# Patient Record
Sex: Female | Born: 1975 | Race: White | Hispanic: No | Marital: Married | Smoking: Never smoker
Health system: Southern US, Community
[De-identification: ages and names within clinical notes are randomized; demographics above are authoritative.]

## PROBLEM LIST (undated history)

## (undated) DIAGNOSIS — L509 Urticaria, unspecified: Secondary | ICD-10-CM

## (undated) DIAGNOSIS — K8020 Calculus of gallbladder without cholecystitis without obstruction: Secondary | ICD-10-CM

## (undated) DIAGNOSIS — T7840XA Allergy, unspecified, initial encounter: Secondary | ICD-10-CM

## (undated) DIAGNOSIS — E785 Hyperlipidemia, unspecified: Secondary | ICD-10-CM

## (undated) DIAGNOSIS — E663 Overweight: Secondary | ICD-10-CM

## (undated) DIAGNOSIS — Z820 Family history of epilepsy and other diseases of the nervous system: Secondary | ICD-10-CM

## (undated) DIAGNOSIS — R6882 Decreased libido: Secondary | ICD-10-CM

## (undated) HISTORY — PX: BLADDER SUSPENSION: SHX72

## (undated) HISTORY — DX: Allergy, unspecified, initial encounter: T78.40XA

## (undated) HISTORY — DX: Overweight: E66.3

## (undated) HISTORY — DX: Family history of epilepsy and other diseases of the nervous system: Z82.0

## (undated) HISTORY — DX: Decreased libido: R68.82

## (undated) HISTORY — PX: ABDOMINAL HYSTERECTOMY: SHX81

## (undated) HISTORY — DX: Urticaria, unspecified: L50.9

## (undated) HISTORY — PX: TONSILLECTOMY: SUR1361

## (undated) HISTORY — DX: Hyperlipidemia, unspecified: E78.5

## (undated) HISTORY — DX: Calculus of gallbladder without cholecystitis without obstruction: K80.20

---

## 1998-06-11 ENCOUNTER — Other Ambulatory Visit: Admission: RE | Admit: 1998-06-11 | Discharge: 1998-06-11 | Payer: Self-pay | Admitting: Obstetrics & Gynecology

## 1998-07-26 ENCOUNTER — Inpatient Hospital Stay (HOSPITAL_COMMUNITY): Admission: AD | Admit: 1998-07-26 | Discharge: 1998-07-26 | Payer: Self-pay | Admitting: Obstetrics & Gynecology

## 1999-02-05 ENCOUNTER — Inpatient Hospital Stay (HOSPITAL_COMMUNITY): Admission: AD | Admit: 1999-02-05 | Discharge: 1999-02-06 | Payer: Self-pay | Admitting: Obstetrics & Gynecology

## 1999-10-29 ENCOUNTER — Other Ambulatory Visit: Admission: RE | Admit: 1999-10-29 | Discharge: 1999-10-29 | Payer: Self-pay | Admitting: Obstetrics & Gynecology

## 2001-06-05 ENCOUNTER — Encounter: Payer: Self-pay | Admitting: Emergency Medicine

## 2001-06-05 ENCOUNTER — Emergency Department (HOSPITAL_COMMUNITY): Admission: EM | Admit: 2001-06-05 | Discharge: 2001-06-05 | Payer: Self-pay

## 2001-06-13 ENCOUNTER — Emergency Department (HOSPITAL_COMMUNITY): Admission: EM | Admit: 2001-06-13 | Discharge: 2001-06-13 | Payer: Self-pay | Admitting: *Deleted

## 2001-06-14 ENCOUNTER — Emergency Department (HOSPITAL_COMMUNITY): Admission: EM | Admit: 2001-06-14 | Discharge: 2001-06-14 | Payer: Self-pay | Admitting: Emergency Medicine

## 2002-01-03 ENCOUNTER — Other Ambulatory Visit: Admission: RE | Admit: 2002-01-03 | Discharge: 2002-01-03 | Payer: Self-pay | Admitting: Obstetrics & Gynecology

## 2002-01-11 ENCOUNTER — Encounter: Payer: Self-pay | Admitting: Obstetrics and Gynecology

## 2002-01-11 ENCOUNTER — Ambulatory Visit (HOSPITAL_COMMUNITY): Admission: RE | Admit: 2002-01-11 | Discharge: 2002-01-11 | Payer: Self-pay | Admitting: Obstetrics and Gynecology

## 2002-11-27 ENCOUNTER — Inpatient Hospital Stay (HOSPITAL_COMMUNITY): Admission: AD | Admit: 2002-11-27 | Discharge: 2002-11-29 | Payer: Self-pay | Admitting: Obstetrics & Gynecology

## 2005-09-27 ENCOUNTER — Other Ambulatory Visit: Admission: RE | Admit: 2005-09-27 | Discharge: 2005-09-27 | Payer: Self-pay | Admitting: Obstetrics & Gynecology

## 2007-01-24 ENCOUNTER — Inpatient Hospital Stay (HOSPITAL_COMMUNITY): Admission: RE | Admit: 2007-01-24 | Discharge: 2007-01-26 | Payer: Self-pay | Admitting: Obstetrics & Gynecology

## 2007-01-24 ENCOUNTER — Encounter (INDEPENDENT_AMBULATORY_CARE_PROVIDER_SITE_OTHER): Payer: Self-pay | Admitting: *Deleted

## 2007-01-31 ENCOUNTER — Inpatient Hospital Stay (HOSPITAL_COMMUNITY): Admission: AD | Admit: 2007-01-31 | Discharge: 2007-01-31 | Payer: Self-pay | Admitting: Obstetrics & Gynecology

## 2007-02-03 ENCOUNTER — Ambulatory Visit (HOSPITAL_COMMUNITY): Admission: RE | Admit: 2007-02-03 | Discharge: 2007-02-04 | Payer: Self-pay | Admitting: Obstetrics and Gynecology

## 2007-03-25 ENCOUNTER — Inpatient Hospital Stay (HOSPITAL_COMMUNITY): Admission: AD | Admit: 2007-03-25 | Discharge: 2007-03-25 | Payer: Self-pay | Admitting: Obstetrics & Gynecology

## 2007-03-27 ENCOUNTER — Emergency Department (HOSPITAL_COMMUNITY): Admission: EM | Admit: 2007-03-27 | Discharge: 2007-03-27 | Payer: Self-pay | Admitting: Emergency Medicine

## 2007-06-13 ENCOUNTER — Ambulatory Visit (HOSPITAL_COMMUNITY): Admission: RE | Admit: 2007-06-13 | Discharge: 2007-06-13 | Payer: Self-pay | Admitting: Family Medicine

## 2007-10-13 ENCOUNTER — Encounter: Admission: RE | Admit: 2007-10-13 | Discharge: 2007-10-13 | Payer: Self-pay | Admitting: Family Medicine

## 2007-11-24 ENCOUNTER — Ambulatory Visit (HOSPITAL_BASED_OUTPATIENT_CLINIC_OR_DEPARTMENT_OTHER): Admission: RE | Admit: 2007-11-24 | Discharge: 2007-11-24 | Payer: Self-pay | Admitting: Orthopedic Surgery

## 2009-12-15 ENCOUNTER — Encounter: Admission: RE | Admit: 2009-12-15 | Discharge: 2009-12-15 | Payer: Self-pay | Admitting: Internal Medicine

## 2010-11-20 ENCOUNTER — Other Ambulatory Visit: Payer: Self-pay | Admitting: Dermatology
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
## 2011-03-05 NOTE — Op Note (Signed)
NAME:  Marisa Chen, BISCHOFF NO.:  [id number]   MEDICAL RECORD NO.:  [id number]          PATIENT TYPE:  AMB   LOCATION:  SDC                           FACILITY:  [facility]   PHYSICIAN:  Randye Lobo, M.D.   DATE OF BIRTH:  09-Jan-1976   DATE OF PROCEDURE:  01/24/2007  DATE OF DISCHARGE:                               OPERATIVE REPORT   PREOPERATIVE DIAGNOSIS:  1. Genuine stress incontinence.  2. Incomplete vaginal prolapse.   POSTOPERATIVE DIAGNOSIS:  1. Genuine stress incontinence.  2. Incomplete vaginal prolapse.   PROCEDURE:  Tension-free vaginal tape suburethral sling, cystoscopy,  anterior colporrhaphy.   SURGEON:  Conley Simmonds, M.D.   ASSISTANT:  Annamaria Helling, M.D.   ANESTHESIA:  Is general endotracheal.   TOTAL IV FLUIDS:  2000 mL   ESTIMATED BLOOD LOSS:  125 mL   URINE OUTPUT:  50 mL   COMPLICATIONS:  None.   INDICATIONS FOR PROCEDURE:  The patient is a 35 year old para 70  Caucasian female who presented to her primary gynecologist, Dr. Annamaria Helling with a complaint of postcoital bleeding and post exercise bleeding.  The patient at that time was noted to have first to second-degree  uterine prolapse.  Upon further questioning, the patient also reported a  history of urinary incontinence with stressful maneuvers.  The patient  did have urodynamic testing performed in the office which confirmed the  presence of genuine stress incontinence.  The patient was also noted to  have a second-degree cystocele.  A plan is now made to proceed with a  total vaginal hysterectomy under the direction of Dr. Annamaria Helling and to  proceed with a tension-free vaginal tape suburethral sling, cystoscopy,  and anterior colporrhaphy under my direction after risks, benefits, and  alternatives have been reviewed.   FINDINGS:  Examination under anesthesia revealed a second-degree uterine  prolapse.  There was a first degree cystocele.  There was evidence of  good posterior  vaginal wall support.   Cystoscopy documented a normal bladder throughout 360 degrees including  the bladder dome and trigone.  There was no evidence of a foreign body  in either the bladder or the urethra with placement of the sling.  The  ureters were noted to be patent bilaterally.   SPECIMEN:  None.   PROCEDURE:  The patient was reidentified in the preoperative hold area.  She did receive clindamycin 900 mg IV for antibiotic prophylaxis.  She  received both TED hose and PAS stockings for DVT prophylaxis.   In the operating room, general endotracheal anesthesia was induced and  the patient was placed in the dorsal lithotomy position.  Dr. Annamaria Helling performed the vaginal hysterectomy.  Please refer to this dictation  separately.  At termination of the vaginal hysterectomy the peritoneum  was closed and the vaginal cuff was opened.  Hemostasis was good.   I then took over the procedure.  A Foley catheter was placed in the  bladder and left to gravity drainage throughout and after the procedure.  A weighted speculum had been previously placed inside the vagina.  The  anterior vaginal wall was marked with Allis clamps in the midline.  The  vaginal mucosa was then injected with half percent lidocaine with  1:200,000 of epinephrine.  The vaginal mucosa was incised vertically  with a scalpel in the midline.  The vaginal mucosa was dissected off of  the endopelvic fascia and subvaginal tissue bilaterally using a  combination of sharp and blunt dissection.  The dissection was carried  back to the level of the pubic rami cranially and down to the level of  the vaginal cuff caudally.  Hemostasis during the dissection was  achieved with monopolar cautery.   The sling was performed next.  It was performed in a top-down fashion.  1 cm suprapubic incisions were created 2.5 cm to the right and left of  the midline.  The abdominal needle passer was then placed first through  the right  suprapubic incision and out through the endopelvic fascia on  the ipsilateral side.  The same procedure that was performed on the  right-hand side was then repeated on the left-hand side.  The Foley  catheter was removed and cystoscopy was performed at this time and the  findings were as noted above.  After cystoscopy was performed the  cystoscopy fluid was drained from the bladder and the Foley catheter was  replaced.  The sling was attached at this time to the abdominal needle  passers and the sling was drawn up through the suprapubic incisions  bilaterally.  The sling was separated from the surrounding plastic  sheath.  A Kelly clamp was placed between the urethra and the sling and  the plastic sheaths were all removed at this time for final placement of  the sling.  The placement was noted to be good.  Excess sling material  was trimmed suprapubically.   The anterior colporrhaphy was performed with a series of vertical  mattress sutures of 2-0 Vicryl for reduction of the cystocele.  There  was some bleeding noted overlying some veins on the patient's right-hand  side of the bladder.  These did respond to figure-of-eight sutures of 2-  0 Vicryl.  There was also some bleeding noted from the exit site of the  sling and the patient's right-hand side and again a figure-of-eight  suture of 2-0 Vicryl was placed in this location.  Gelfoam was also  placed in this region.  Hemostasis was satisfactory.  Excess vaginal  mucosa was trimmed and the anterior vaginal wall was then closed with a  running locked suture of 2-0 Vicryl.   The vaginal cuff was closed with a running locked suture of 0 Vicryl.  The culdoplasty suture was tied at this time and there was good support  of the vaginal apex.  The vaginal packing was placed in the vagina and  this had Estrace cream on it.   The suprapubic incisions were closed with Dermabond.  The patient was cleansed of Betadine, taken out of the dorsal  lithotomy  position and escorted to the recovery room in stable and awake  condition.  There were no complications to the procedure.  All needle,  instrument, sponge counts were correct.      Randye Lobo, M.D.  Electronically Signed     BES/MEDQ  D:  01/24/2007  T:  01/24/2007  Job:  [id number]

## 2011-03-05 NOTE — H&P (Signed)
NAME:  Marisa Chen, TOSTE NO.:  [id number]   MEDICAL RECORD NO.:  [id number]          PATIENT TYPE:  AMB   LOCATION:  SDC                           FACILITY:  [facility]   PHYSICIAN:  Gerrit Friends. Aldona Bar, M.D.   DATE OF BIRTH:  [birth year]   DATE OF ADMISSION:  01/24/2007  DATE OF DISCHARGE:                              HISTORY & PHYSICAL   HISTORY OF PRESENT ILLNESS:  Sedalia Greeson is a 35 year old gravida 4,  para 3, AB 1 white female being taken to the Operating Room for a total  abdominal hysterectomy, a transvaginal sling, an anterior repair and  cystoscopy because of a history of menorrhagia, first to second-degree  uterine prolapse, and stress urinary incontinence.   This patient was seen by me in February 2008 complaining of worsening  stress urinary incontinence demonstrated by a history of stress urinary  incontinence and first, almost second-degree uterine prolapse.  She was  also complaining of some irregular menses.  It was determined at that  point that a vaginal hysterectomy was appropriate because of the pelvic  relaxation, and urodynamics was scheduled to evaluate the patient's  incontinence.  She subsequently underwent urodynamics in consultation  with Dr. Conley Simmonds with the determination that the patient's problem  was related to stress urinary incontinence and, therefore, as a part of  this operative procedure Dr. Edward Jolly will perform a TVT and cystoscopy and  anterior repair.   The patient's history is relatively benign.  She has had three vaginal  deliveries with some babies that were in excess of nine pounds.  She has  noticed some symptoms of pelvic pressure for the past year or two along  with stress urinary incontinence which has been progressive and has  become more of a problem within the past 3-4 months, especially with  increased exercising.  All of her cervical cytology in the past has been  normal, and basically she has been, and remains, in good  health.   Her husband had a vasectomy, so further childbearing is not an issue.   PAST MEDICAL HISTORY:  Negative with the exception of the above.   ALLERGIES:  The patient is allergic to PENICILLIN and CIPRO.   PAST SURGICAL HISTORY:  Essentially none.   FAMILY HISTORY/REVIEW OF SYSTEMS/SOCIAL HISTORY:  Essentially negative -  noncontributory.   PHYSICAL EXAMINATION:  GENERAL:  Examination at the time of admission  finds a well-developed female.  VITAL SIGNS:  Weight ___, height 5 feet 3 inches, blood pressure 110/70,  temperature 98.2, pulse 80 and regular, respirations 18 and regular.  HEENT:  Negative.  NECK:  Thyroid not enlarged.  CHEST:  Clear to auscultation and percussion.  CARDIOVASCULAR:  Regular rhythm without murmur.  BREASTS:  Negative.  ABDOMEN:  Unremarkable.  PELVIC:  External genitalia BUS normal.  There is a first to second-  degree uterine prolapse.  The uterus is normal size, very mobile.  There  is a first-degree cystocele, and remarkably no rectocele.  Rectovaginal  exam confirmatory.  EXTREMITIES:  Negative.  NEUROLOGIC:  Physiologic.   IMPRESSION:  First to  second-degree uterine prolapse associated with  stress urinary incontinence.   PLAN:  I will perform a total vaginal hysterectomy, and Dr. Edward Jolly will  perform a TVT, anterior repair, and cystoscopy.      Gerrit Friends. Aldona Bar, M.D.  Electronically Signed     RMW/MEDQ  D:  01/19/2007  T:  01/19/2007  Job:  [id number]

## 2011-03-05 NOTE — Op Note (Signed)
NAME:  Marisa Chen, Marisa Chen NO.:  [id number]   MEDICAL RECORD NO.:  [id number]          PATIENT TYPE:  OIB   LOCATION:  9305                          FACILITY:  [facility]   PHYSICIAN:  Randye Lobo, M.D.   DATE OF BIRTH:  June 24, 1976   DATE OF PROCEDURE:  02/03/2007  DATE OF DISCHARGE:  02/04/2007                               OPERATIVE REPORT   PREOPERATIVE DIAGNOSIS:  1. Status post total vaginal hysterectomy with McCall's culdoplasty,      tension free vaginal tape suburethral sling, cystoscopy, and      anterior colporrhaphy on January 24, 2007.  2..  Anterior vaginal wall hematoma.   POSTOPERATIVE DIAGNOSIS:  1. Status post total vaginal hysterectomy with McCall's culdoplasty,      tension free vaginal tape suburethral sling, cystoscopy and      anterior colporrhaphy on January 24, 2007.  2. Anterior vaginal wall hematoma.   PROCEDURE:  Evacuation of the anterior vaginal wall hematoma and repair  of vaginal wall dehiscence, cystoscopy.   SURGEON:  Randye Lobo, M.D.   ANESTHESIA:  General endotracheal anesthesia.   ESTIMATED BLOOD LOSS:  Less than 50 mL   URINE OUTPUT:  Quantity sufficient.   COMPLICATIONS:  None.   INDICATIONS FOR PROCEDURE:  The patient is a 35 year old gravida 4 para  3-0-1-3, Caucasian female, status post total vaginal hysterectomy and  McCall's culdoplasty with the tension free vaginal tape suburethral  sling, cystoscopy, and anterior colporrhaphy on January 24, 2007, under the  direction of Dr. Annamaria Helling and Dr. Conley Simmonds, who presented to the  office on February 03, 2007, with vaginal bleeding and pain in the rectum  with voiding.  The patient had undergone an uneventful surgical  procedure and was discharged to home on January 26, 2007, with a Foley  catheter to gravity drainage due to some intermittent urinary retention  postop.  The patient's white blood cell count on postoperative day one  was elevated, but this returned down back to a  normal range by  postoperative day two.  The patient did receive surgical prophylaxis of  antibiotics with clindamycin due to her penicillin and ciprofloxacin  allergies.  The patient was sent home with a prescription of Macrobid as  she was discharged with her Foley catheter in place and had gone through  multiple catheterizations.   The patient was seen for her first postoperative visit on January 30, 2007.  She had taken her catheter out that morning and was able to void  well in the office with a 25 mL residual urine volume.  The patient was  examined and, at that time, she was noted to have a 0.5 cm left anterior  vaginal wall mucosal opening which was draining a small hematoma.  The  vaginal sutures were all noted to be intact at that time including the  vaginal cuff.  One day later, the patient had her Foley catheter  replaced due to urinary retention.  She was continued on antibiotics at  that time.   The patient was seen in the office on the  date of this procedure, today,  for her vaginal bleeding and discomfort and, at that time, the anterior  vaginal wall was noted to have a dehiscence and there was evidence of an  old hematoma.  The vaginal cuff appeared to be intact at this time.  A  discussion was held with the patient regarding her diagnosis and a plan  was made to bring the patient to the operating room for further  evacuation of the hematoma, closure of the anterior vaginal wall,  exploration of the operative sites, and cystoscopy.  The patient chose  to proceed after risks, benefits, and alternatives were reviewed.   FINDINGS:  The anterior vaginal wall was noted to be open along 2/3 of  the incision length.  There was old hematoma which was adherent to the  bladder underlying the vaginal mucosa.  The suburethral sling was  evaluated and was noted to be in good position and already had some  tissue ingrowth.  There was no obvious area of active bleeding  appreciated.   The vaginal cuff and the culdoplasty suture were noted to  be intact.   DESCRIPTION OF PROCEDURE:  The patient was reidentified in the  preoperative hold area.  She did receive clindamycin 900 mg IV for  antibiotic prophylaxis.  The patient had PAS stockings and TED hose for  DVT prophylaxis.  In the operating room, general endotracheal anesthesia  was induced.  The patient was then placed in the dorsal lithotomy  position.  The vagina, lower abdomen, and perineum were then sterilely  prepped and draped.  A new Foley catheter was sterilely placed inside  the bladder.   An examination under anesthesia was performed and the findings are as  noted above.  Saline was used to irrigate the anterior vaginal wall and  operative site.  A sponge was also used to remove any adherent clots  from the surgical field.  There was no evidence of any active bleeding  appreciated.  Cystoscopy was performed after the Foley catheter was  removed.  There was no evidence of foreign body in the bladder or the  urethra.  The bladder was visualized throughout 360 degrees including  the bladder dome and trigone.  The ureters were noted to be patent  bilaterally.  The cystoscope was removed and the Foley catheter was  replaced to drain the bladder.   The edges of the vaginal mucosa were sharply excised so that the vaginal  mucosa had healthy edges and good blood supply to the tissue.  The  anterior vaginal wall was then closed at this time with a running locked  suture of 0 Vicryl.  There was good hemostasis appreciated.  An Estrace  gauze packing was then placed in the vagina.  This concluded the  patient's procedure.  There were no complications.  All needle,  instrument, and sponge counts were correct.      Randye Lobo, M.D.  Electronically Signed     BES/MEDQ  D:  02/03/2007  T:  02/04/2007  Job:  [id number]

## 2011-03-05 NOTE — Discharge Summary (Signed)
NAME:  Marisa Chen, Marisa Chen NO.:  [id number]   MEDICAL RECORD NO.:  [id number]          PATIENT TYPE:  INP   LOCATION:  9304                          FACILITY:  [facility]   PHYSICIAN:  Gerrit Friends. Aldona Bar, M.D.   DATE OF BIRTH:  04-08-1976   DATE OF ADMISSION:  01/24/2007  DATE OF DISCHARGE:  01/26/2007                               DISCHARGE SUMMARY   DISCHARGE DIAGNOSES:  Status post total vaginal hysterectomy, anterior  repair, transvaginal tape, cystoscopy - for uterine prolapse and stress  urinary incontinence.   SUMMARY:  This 35 year old female was admitted to the hospital on the  day of surgery at which time she underwent a total vaginal hysterectomy,  anterior repair, TVT and cystoscopy for uterine prolapse and associated  stress urinary incontinence.  Procedure went well.  Pathology pending at  the time of discharge.  The patient's admitting hemoglobin was 14.7.  Discharge hemoglobin 11.9.  Discharge white count 9400 with a normal  platelet count.  Her postoperative course initially was complicated by  some urinary retention after an attempt to remove the catheter on April  9.  Catheter was replaced ,and the patient was continued to be observed  in the hospital, and by the morning of April 10, was able to void well  with no significant postvoid residual, and on the morning of April 10,  she was ambulating well, tolerating a regular diet well, having normal  bowel function and bladder function.  Was afebrile, was comfortable and  was deemed ready for discharge.  As mentioned, pathology on the uterus  is still pending.   DISCHARGE MEDICATIONS:  1. Tylox 1 to 2 every 4 to 6 hours as needed for severe pain.  2. Motrin 600 mg every 6 hours as needed for less severe pain or      cramping.  3. Ambien 10 mg at bedtime as needed for sleep.  4. Macrobid 1 twice a day for the next 4 days for bladder prophylaxis.   She will return to see Dr. Edward Jolly who did the anterior repair,  TVT and  cystoscopy in 10 days and return to see me in approximately three weeks'  time.  She understood all instructions well at the time of discharge and  was discharged to home in good condition.      Gerrit Friends. Aldona Bar, M.D.  Electronically Signed     RMW/MEDQ  D:  01/26/2007  T:  01/26/2007  Job:  [id number]

## 2011-03-05 NOTE — Op Note (Signed)
NAME:  Marisa Chen, OLDS NO.:  [id number]   MEDICAL RECORD NO.:  [id number]          PATIENT TYPE:  AMB   LOCATION:  SDC                           FACILITY:  [facility]   PHYSICIAN:  Gerrit Friends. Aldona Bar, M.D.   DATE OF BIRTH:  [birth year]   DATE OF PROCEDURE:  01/24/2007  DATE OF DISCHARGE:                               OPERATIVE REPORT   PREOPERATIVE DIAGNOSIS:  1. Menorrhagia.  2. Stress urinary incontinence.  3. Incomplete vaginal and uterine prolapse.   PROCEDURES:  Total vaginal hysterectomy - Dr. Aldona Bar.  Anterior  colporrhaphy, TVT and cystoscopy by Dr. Edward Jolly.   ANESTHESIA:  General endotracheal.   DESCRIPTION OF PROCEDURE:  The patient was taken to the operating room  where after the satisfactory induction of general endotracheal  anesthesia she was prepped and draped and placed in the modified  lithotomy position in the short Allen stirrups.  She was prepped and  draped, the bladder was drained of clear urine with red rubber catheter  in-and-out fashion.  At this time the vaginal hysterectomy portion of  procedure was begun.   A posterior weighted speculum put into place and the cervix grasped with  a double tooth tenaculum and pulled down to the introitus with ease.  At  this time the cervix was circumferentially injected with dilute solution  Neo-Synephrine to aid in hemostasis and thereafter a circumferential  incision was made about the cervix and vagina pushed off the cervix with  ease.  Posterior peritoneum was entered without difficulty and the  posterior surface of uterus palpated normally with no adhesions felt.  At this time the uterosacral pedicles were taken bilaterally using  curved Heaney clamps and suture ligature of 0 Vicryl suture followed by  an additional parametrial bite taken in similar fashion bilaterally.   At this time it was possible to dissect out the anterior peritoneum and  prior to the anterior peritoneum being entered an additional  parametrial  bite was taken using curved Heaney clamps and suture ligature of 0  Vicryl suture.  At this time the anterior peritoneum was entered and  confirmation by visualizing bowel was noted.  At this time the uterine  artery pedicles were taken bilaterally using curved Heaney clamps and  suture ligature of 0 Vicryl suture.  One additional parametrial bite was  taken above the uterine artery pedicles bilaterally in the similar  fashion a previously described and at this time the specimen was  everted.  The ovarian pedicles were clamped, cut and thereafter doubly  suture secured with 0 Vicryl suture.  Specimen was removed and sent to  pathology labeled appropriately.  At this time all pedicles noted to be  dry.  The posterior cuff at this time was closed in a hemostatic fashion  using 0 Vicryl running locking fashion from uterosacral to uterosacral.  A McCall culdoplasty suture was then placed using 0 Vicryl suture and  left untied.  All pedicles noted to be dry.  Decision was made at this  time to close the peritoneum which was carried out in a pursestring  fashion with  0 Vicryl suture.  Extraperitonealizing all pedicles with  the  exception of the ovarians which were cut free.  At this point in  procedure with hemostasis excellent, the patient's vital signs stable,  attention was turned to the anterior colporrhaphy and TVT and cystoscopy  which was carried out by Dr. Edward Jolly with my assistance and the remainder  of the operative note will be dictated by Dr. Edward Jolly.      Gerrit Friends. Aldona Bar, M.D.  Electronically Signed     RMW/MEDQ  D:  01/24/2007  T:  01/24/2007  Job:  [id number]

## 2011-07-09 LAB — POCT HEMOGLOBIN-HEMACUE: Hemoglobin: 14.2

## 2011-08-05 LAB — COMPREHENSIVE METABOLIC PANEL
AST: 21
Albumin: 4.1
Calcium: 9.2
Chloride: 101
Creatinine, Ser: 0.79
GFR calc Af Amer: >60
Sodium: 134 — ABNORMAL LOW
Total Bilirubin: 0.8

## 2011-08-05 LAB — URINALYSIS, ROUTINE W REFLEX MICROSCOPIC
Ketones, ur: 15 — AB
Leukocytes, UA: NEGATIVE
Nitrite: NEGATIVE
Protein, ur: NEGATIVE
pH: 6.5

## 2011-08-05 LAB — CBC
HCT: 38.5
MCV: 86.4
Platelets: 250
RDW: 12.6

## 2011-08-05 LAB — DIFFERENTIAL
Eosinophils Relative: 0
Lymphocytes Relative: 6 — ABNORMAL LOW
Lymphs Abs: 0.8
Monocytes Absolute: 0.4

## 2011-08-05 LAB — URINE MICROSCOPIC-ADD ON

## 2011-12-01 ENCOUNTER — Other Ambulatory Visit: Payer: Self-pay | Admitting: Dermatology

## 2012-02-24 ENCOUNTER — Ambulatory Visit (HOSPITAL_BASED_OUTPATIENT_CLINIC_OR_DEPARTMENT_OTHER)
Admission: RE | Admit: 2012-02-24 | Discharge: 2012-02-24 | Disposition: A | Discharge status: Home / Self Care | Payer: BC Managed Care – PPO | Source: Ambulatory Visit | Attending: Family Medicine | Admitting: Family Medicine

## 2012-02-24 ENCOUNTER — Other Ambulatory Visit (HOSPITAL_BASED_OUTPATIENT_CLINIC_OR_DEPARTMENT_OTHER): Payer: Self-pay | Admitting: Family Medicine

## 2012-02-24 DIAGNOSIS — R1011 Right upper quadrant pain: Secondary | ICD-10-CM

## 2012-02-24 DIAGNOSIS — K8020 Calculus of gallbladder without cholecystitis without obstruction: Secondary | ICD-10-CM | POA: Insufficient documentation

## 2012-02-24 DIAGNOSIS — R109 Unspecified abdominal pain: Secondary | ICD-10-CM | POA: Insufficient documentation

## 2012-02-24 DIAGNOSIS — R110 Nausea: Secondary | ICD-10-CM | POA: Insufficient documentation

## 2012-03-15 HISTORY — PX: CHOLECYSTECTOMY: SHX55

## 2012-04-11 ENCOUNTER — Other Ambulatory Visit (HOSPITAL_BASED_OUTPATIENT_CLINIC_OR_DEPARTMENT_OTHER): Payer: Self-pay | Admitting: Family Medicine

## 2012-04-11 DIAGNOSIS — Z1231 Encounter for screening mammogram for malignant neoplasm of breast: Secondary | ICD-10-CM

## 2012-04-25 ENCOUNTER — Ambulatory Visit (HOSPITAL_BASED_OUTPATIENT_CLINIC_OR_DEPARTMENT_OTHER)
Admission: RE | Admit: 2012-04-25 | Discharge: 2012-04-25 | Disposition: A | Discharge status: Home / Self Care | Payer: BC Managed Care – PPO | Source: Ambulatory Visit | Attending: Family Medicine | Admitting: Family Medicine

## 2012-04-25 DIAGNOSIS — Z1231 Encounter for screening mammogram for malignant neoplasm of breast: Secondary | ICD-10-CM | POA: Insufficient documentation

## 2013-02-07 ENCOUNTER — Telehealth: Payer: Self-pay | Admitting: Family Medicine

## 2013-02-07 NOTE — Telephone Encounter (Signed)
PT CALLED AND HAD A QUESTION FOR DR. ZANARD.  A FRIEND OF HER'S IS PREGNANT AND WAS BITTEN BY A TICK.  FRIEND DOES NOT HAVE INS. AND PT WAS NEEDING ADVICE.  I ADVISED I WOULD GIVE THE DR. THE MESSAGE.  PT CALL BACK NUMBER [phone]

## 2013-05-28 ENCOUNTER — Encounter: Payer: Self-pay | Admitting: Family Medicine

## 2013-05-28 ENCOUNTER — Ambulatory Visit (INDEPENDENT_AMBULATORY_CARE_PROVIDER_SITE_OTHER): Payer: No Typology Code available for payment source | Admitting: Family Medicine

## 2013-05-28 VITALS — BP 119/80 | HR 60 | Resp 16 | Ht 63.0 in | Wt 149.0 lb

## 2013-05-28 DIAGNOSIS — L989 Disorder of the skin and subcutaneous tissue, unspecified: Secondary | ICD-10-CM

## 2013-05-28 NOTE — Progress Notes (Signed)
  Subjective:    Patient ID: Marisa Chen, female    DOB: 1976/04/14, 37 y.o.   MRN: [id number]  HPI  Marisa Chen is here to have a red spot on her chest evaluated.  It appeared a couple of weeks ago.  She denies any pain and says that it does not itch.    Review of Systems  Skin:       Red Spot on Chest     Past Medical History  Diagnosis Date  . Hyperlipidemia   . FHx: migraine headaches   . Cholelithiasis   . Decreased libido   . Overweight(278.02)     Family History  Problem Relation Age of Onset  . Heart disease Mother   . Hyperlipidemia Mother   . Asthma Sister   . Cancer Maternal Grandmother     Breast Cancer  . Heart disease Maternal Grandfather     History   Social History Narrative   Marital Status: Married IT sales professional)    Children:  Daughters (2) Son (1)    Pets: Cows/ Chickens/ Dogs    Living Situation: Lives with husband and children.   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     Education: Engineer, agricultural   Tobacco Use/Exposure:  None    Alcohol Use:  Occasional   Drug Use:  None   Diet:  Regular   Exercise: Limited    Hobbies: Art/Running               Objective:   Physical Exam  Vitals reviewed. Constitutional: She appears well-nourished. No distress.  Eyes: No scleral icterus.  Cardiovascular: Normal rate and regular rhythm.   Pulmonary/Chest: No respiratory distress.  Skin:             Assessment & Plan:

## 2013-05-28 NOTE — Patient Instructions (Addendum)
1)  Skin  Lesion - This lesion looks like it could be either a cherry hemangioma vs dermatofibroma vs basal cell.  I would recommend that you follow up with Dr. Margo Aye.  You have an appointment on Sept 2 nd at 3:40 pm.  He is off of [address] near [employer].    Cherry Angioma Cherry angiomas are noncancerous (benign) skin growths. They are made up of a clump of blood vessels. They occur most often in people over the age of 28. CAUSES  The cause of these skin growths is unknown, but they appear to run in families. SYMPTOMS  Cherry angiomas are smooth, round, red bumps on the skin. They can be as small as a pinhead or as big as a pencil eraser. The color may darken to a purplish red over time. Cherry angiomas are usually found on the trunk, but they can occur anywhere on the body. They are painless, but they may bleed if they are injured. The bleeding is not serious and will stop when firm pressure is applied. DIAGNOSIS  Your caregiver can usually tell what is wrong by performing a physical exam. A tissue sample (biopsy) may also be taken and examined under a microscope. TREATMENT  Usually no treatment is needed for cherry angiomas. They may be removed for improved appearance (cosmetic) reasons. Sometimes, cherry angiomas come back after removal. Removal methods include:  Electrocautery. Heat is used to burn the growth off the skin.  Cryosurgery. Liquid nitrogen is applied to the growth to freeze it. The growth eventually falls off the skin.  Surgery. HOME CARE INSTRUCTIONS  If your skin was covered with a bandage, change and remove the bandage as directed by your caregiver.  Check your skin regularly for any changes. SEEK MEDICAL CARE IF: You notice any changes or new growths on your skin. Document Released: 12/13/2001 Document Revised: 12/27/2011 Document Reviewed: 11/12/2011 [facility] Patient Information 2014 [place], [place].

## 2013-07-14 ENCOUNTER — Encounter: Payer: Self-pay | Admitting: Family Medicine

## 2013-07-14 DIAGNOSIS — L989 Disorder of the skin and subcutaneous tissue, unspecified: Secondary | ICD-10-CM | POA: Insufficient documentation

## 2013-07-14 NOTE — Assessment & Plan Note (Addendum)
I am not sure what this lesion is.  I think it could be either a cherry hemangioma vs dermatofibroma vs a basal cell.  She is scheduled to see Dr. Margo Aye on 06/19/13 at 3:40.

## 2014-01-03 ENCOUNTER — Ambulatory Visit: Payer: No Typology Code available for payment source | Admitting: Family Medicine

## 2014-02-11 ENCOUNTER — Encounter (INDEPENDENT_AMBULATORY_CARE_PROVIDER_SITE_OTHER): Payer: Self-pay

## 2014-02-11 ENCOUNTER — Ambulatory Visit (INDEPENDENT_AMBULATORY_CARE_PROVIDER_SITE_OTHER): Payer: BC Managed Care – PPO | Admitting: Family Medicine

## 2014-02-11 ENCOUNTER — Encounter: Payer: Self-pay | Admitting: Family Medicine

## 2014-02-11 VITALS — BP 116/77 | HR 62 | Resp 16 | Wt 171.0 lb

## 2014-02-11 DIAGNOSIS — G43909 Migraine, unspecified, not intractable, without status migrainosus: Secondary | ICD-10-CM

## 2014-02-11 DIAGNOSIS — R5381 Other malaise: Secondary | ICD-10-CM

## 2014-02-11 DIAGNOSIS — E669 Obesity, unspecified: Secondary | ICD-10-CM

## 2014-02-11 DIAGNOSIS — R5383 Other fatigue: Secondary | ICD-10-CM

## 2014-02-11 MED ORDER — PHENDIMETRAZINE TARTRATE 35 MG PO TABS: 1.0000 | ORAL_TABLET | Freq: Three times a day (TID) | ORAL | Status: DC

## 2014-02-11 MED ORDER — TOPIRAMATE 50 MG PO TABS: 50.0000 mg | ORAL_TABLET | Freq: Two times a day (BID) | ORAL | Status: DC

## 2014-02-11 MED ORDER — CYANOCOBALAMIN 1000 MCG/ML IJ SOLN: INTRAMUSCULAR | Status: AC

## 2014-02-11 NOTE — Progress Notes (Signed)
   Subjective:    Patient ID: Marisa Chen, female    DOB: March 23, 1976, 38 y.o.   MRN: [id number]  HPI  Marisa Chen is here today for a couple of issues:  1)  Migraine Headaches - She has been having increased headaches.    2)  Weight - She would like to do another round of HCG.     Review of Systems  Constitutional: Positive for fatigue and unexpected weight change. Negative for activity change and appetite change.  Respiratory: Negative for shortness of breath.   Cardiovascular: Negative for chest pain and palpitations.  Gastrointestinal: Negative.   Genitourinary: Negative.   Neurological: Positive for headaches.  Psychiatric/Behavioral: Negative.      Past Medical History  Diagnosis Date  . Hyperlipidemia   . FHx: migraine headaches   . Cholelithiasis   . Decreased libido   . Overweight(278.02)   . Allergy      Past Surgical History  Procedure Laterality Date  . Abdominal hysterectomy    . Bladder suspension    . Tonsillectomy    . Cholecystectomy  03/15/2012     History   Social History Narrative   Marital Status: Married IT sales professional(Jonathan)    Children:  Daughters (2) Son (1)    Pets: Cows/ Chickens/ Dogs    Living Situation: Lives with husband and children.   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     Education: Engineer, agriculturalHigh School Graduate   Tobacco Use/Exposure:  None    Alcohol Use:  Occasional   Drug Use:  None   Diet:  Regular   Exercise: Limited    Hobbies: Art/Running                 Family History  Problem Relation Age of Onset  . Heart disease Mother   . Hyperlipidemia Mother   . Asthma Sister   . Cancer Maternal Grandmother     Breast Cancer  . Heart disease Maternal Grandfather      Allergies  Allergen Reactions  . Ciprofloxacin   . Penicillins      Immunization History  Administered Date(s) Administered  . Tdap 10/18/2006        Objective:   Physical Exam  Vitals  reviewed. Constitutional: She is oriented to person, place, and time.  HENT:  Head: Normocephalic and atraumatic.  Eyes: Conjunctivae are normal. No scleral icterus.  Neck: Normal range of motion. Neck supple. No thyromegaly present.  Cardiovascular: Normal rate and regular rhythm.   Pulmonary/Chest: Effort normal and breath sounds normal.  Musculoskeletal: She exhibits no edema.  Neurological: She is alert and oriented to person, place, and time.  Skin: Skin is warm and dry. No rash noted.  Psychiatric: She has a normal mood and affect. Her behavior is normal. Judgment and thought content normal.       Assessment & Plan:    Marisa Chen was seen today for weight check.  Diagnoses and associated orders for this visit:  Migraine, unspecified, without mention of intractable migraine without mention of status migrainosus - topiramate (TOPAMAX) 50 MG tablet; Take 1 tablet (50 mg total) by mouth 2 (two) times daily.  Other malaise and fatigue - cyanocobalamin (,VITAMIN B-12,) 1000 MCG/ML injection; Inject .5 ml (50 units) into the muscle weekly  Obesity, unspecified - Phendimetrazine Tartrate 35 MG TABS; Take 1 tablet (35 mg total) by mouth 3 (three) times daily before meals.

## 2014-03-14 ENCOUNTER — Encounter: Payer: Self-pay | Admitting: Family Medicine

## 2014-03-14 ENCOUNTER — Ambulatory Visit (HOSPITAL_BASED_OUTPATIENT_CLINIC_OR_DEPARTMENT_OTHER)
Admission: RE | Admit: 2014-03-14 | Discharge: 2014-03-14 | Disposition: A | Discharge status: Home / Self Care | Payer: BC Managed Care – PPO | Source: Ambulatory Visit | Attending: Family Medicine | Admitting: Family Medicine

## 2014-03-14 ENCOUNTER — Ambulatory Visit (INDEPENDENT_AMBULATORY_CARE_PROVIDER_SITE_OTHER): Payer: BC Managed Care – PPO | Admitting: Family Medicine

## 2014-03-14 ENCOUNTER — Other Ambulatory Visit: Payer: Self-pay | Admitting: Family Medicine

## 2014-03-14 VITALS — BP 128/77 | HR 68 | Resp 16 | Ht 64.0 in | Wt 164.0 lb

## 2014-03-14 DIAGNOSIS — M25572 Pain in left ankle and joints of left foot: Secondary | ICD-10-CM | POA: Insufficient documentation

## 2014-03-14 DIAGNOSIS — W010XXA Fall on same level from slipping, tripping and stumbling without subsequent striking against object, initial encounter: Secondary | ICD-10-CM | POA: Insufficient documentation

## 2014-03-14 DIAGNOSIS — S8263XA Displaced fracture of lateral malleolus of unspecified fibula, initial encounter for closed fracture: Secondary | ICD-10-CM | POA: Insufficient documentation

## 2014-03-14 DIAGNOSIS — M25571 Pain in right ankle and joints of right foot: Secondary | ICD-10-CM

## 2014-03-14 DIAGNOSIS — M25579 Pain in unspecified ankle and joints of unspecified foot: Secondary | ICD-10-CM

## 2014-03-14 NOTE — Progress Notes (Signed)
Subjective:    Patient ID: Marisa Chen, female    DOB: 03/11/1976, 38 y.o.   MRN: [id number]  HPI  Toini is here today complaining of pain and swelling in her right ankle. She fell while running yesterday. This morning her ankle was much more swollen and bruised.  She wants to be sure she did not break her ankle or foot.    Review of Systems  Constitutional: Positive for activity change (due to foot pain/swelling). Negative for fatigue.  Cardiovascular: Negative for leg swelling.  Musculoskeletal: Positive for joint swelling.       Her right foot is bruised, swelled and painful  All other systems reviewed and are negative.    Past Medical History  Diagnosis Date  . Hyperlipidemia   . FHx: migraine headaches   . Cholelithiasis   . Decreased libido   . Overweight(278.02)   . Allergy      Past Surgical History  Procedure Laterality Date  . Abdominal hysterectomy    . Bladder suspension    . Tonsillectomy    . Cholecystectomy  03/15/2012     History   Social History Narrative   Marital Status: Married IT sales professional)    Children:  Daughters (2) Son (1)    Pets: Cows/ Chickens/ Dogs    Living Situation: Lives with husband and children.   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     Education: Engineer, agricultural   Tobacco Use/Exposure:  None    Alcohol Use:  Occasional   Drug Use:  None   Diet:  Regular   Exercise: Limited    Hobbies: Art/Running                 Family History  Problem Relation Age of Onset  . Heart disease Mother   . Hyperlipidemia Mother   . Asthma Sister   . Cancer Maternal Grandmother     Breast Cancer  . Heart disease Maternal Grandfather      Current Outpatient Prescriptions on File Prior to Visit  Medication Sig Dispense Refill  . cyanocobalamin (,VITAMIN B-12,) 1000 MCG/ML injection Inject .5 ml (50 units) into the muscle weekly  4 mL  0  . Phendimetrazine  Tartrate 35 MG TABS Take 1 tablet (35 mg total) by mouth 3 (three) times daily before meals.  90 each  0  . topiramate (TOPAMAX) 50 MG tablet Take 1 tablet (50 mg total) by mouth 2 (two) times daily.  60 tablet  5   No current facility-administered medications on file prior to visit.     Allergies  Allergen Reactions  . Ciprofloxacin   . Penicillins      Immunization History  Administered Date(s) Administered  . Tdap 10/18/2006       Objective:   Physical Exam  Nursing note and vitals reviewed. Constitutional: She appears well-nourished.  Neck: Normal range of motion.  Cardiovascular: Normal rate and regular rhythm.   Musculoskeletal: She exhibits tenderness.       Right foot: She exhibits tenderness, bony tenderness and swelling.  Skin: Skin is dry.  Psychiatric: She has a normal mood and affect. Her behavior is normal. Judgment and thought content normal.      Assessment & Plan:    Adream was seen today for ankle injury.  Diagnoses and associated orders for this visit:  Right ankle pain Comments: Her x-ray showed the following:  Avulsion fracture off the lateral  malleolus; [facility]    TIME SPENT "FACE TO FACE" WITH PATIENT -  15 MINS

## 2014-03-15 ENCOUNTER — Ambulatory Visit: Payer: BC Managed Care – PPO | Admitting: Family Medicine

## 2014-04-02 ENCOUNTER — Telehealth: Payer: Self-pay

## 2014-04-02 NOTE — Telephone Encounter (Signed)
Alan RipperClaire wants to know if she can get the medicine without coming in to be seen. She wants to start back on 6.26.2015

## 2014-05-31 DIAGNOSIS — M25571 Pain in right ankle and joints of right foot: Secondary | ICD-10-CM | POA: Insufficient documentation

## 2014-10-31 IMAGING — CR DG ANKLE COMPLETE 3+V*R*
3 series · 3 of 3 positions shown · non-contrast
Comparison: None.

CLINICAL DATA: Fall, twisted ankle.  Lateral pain, swelling.

EXAM:
RIGHT ANKLE - COMPLETE 3+ VIEW

[t ankle joint ap right]
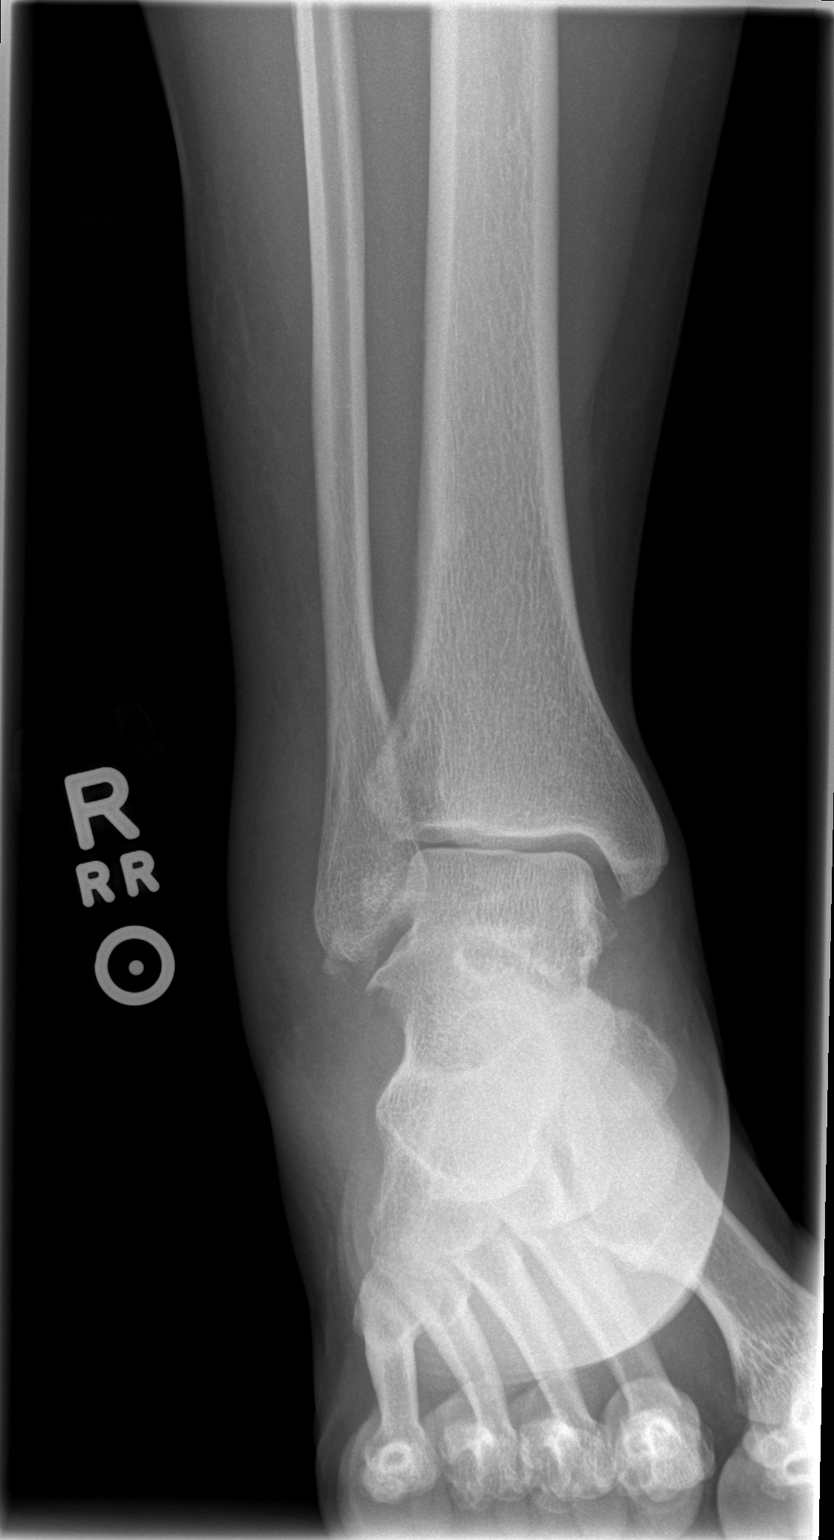

[t ankle joint oblique right]
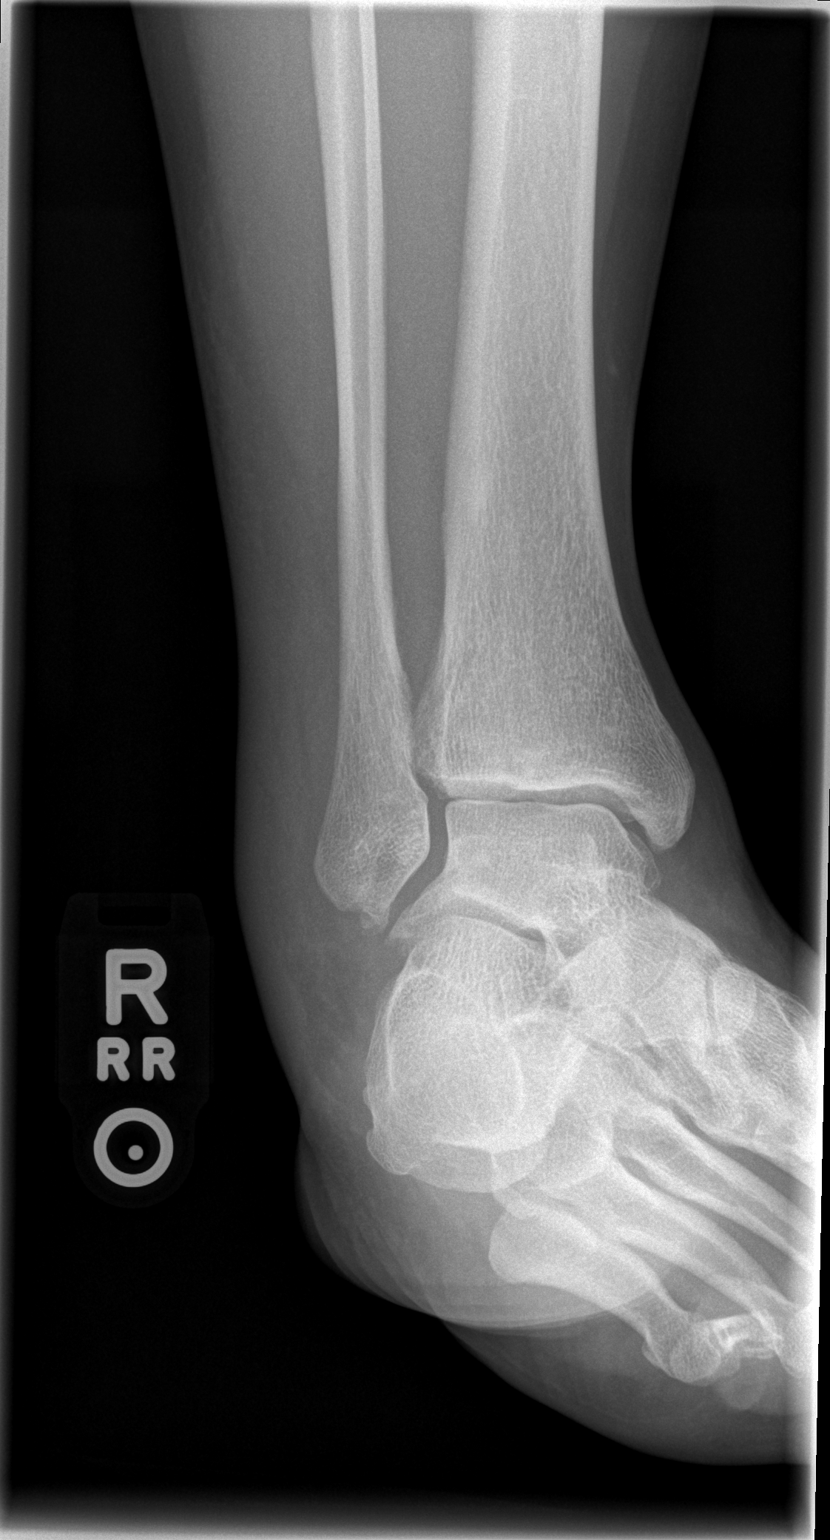

[t ankle joint lat right]
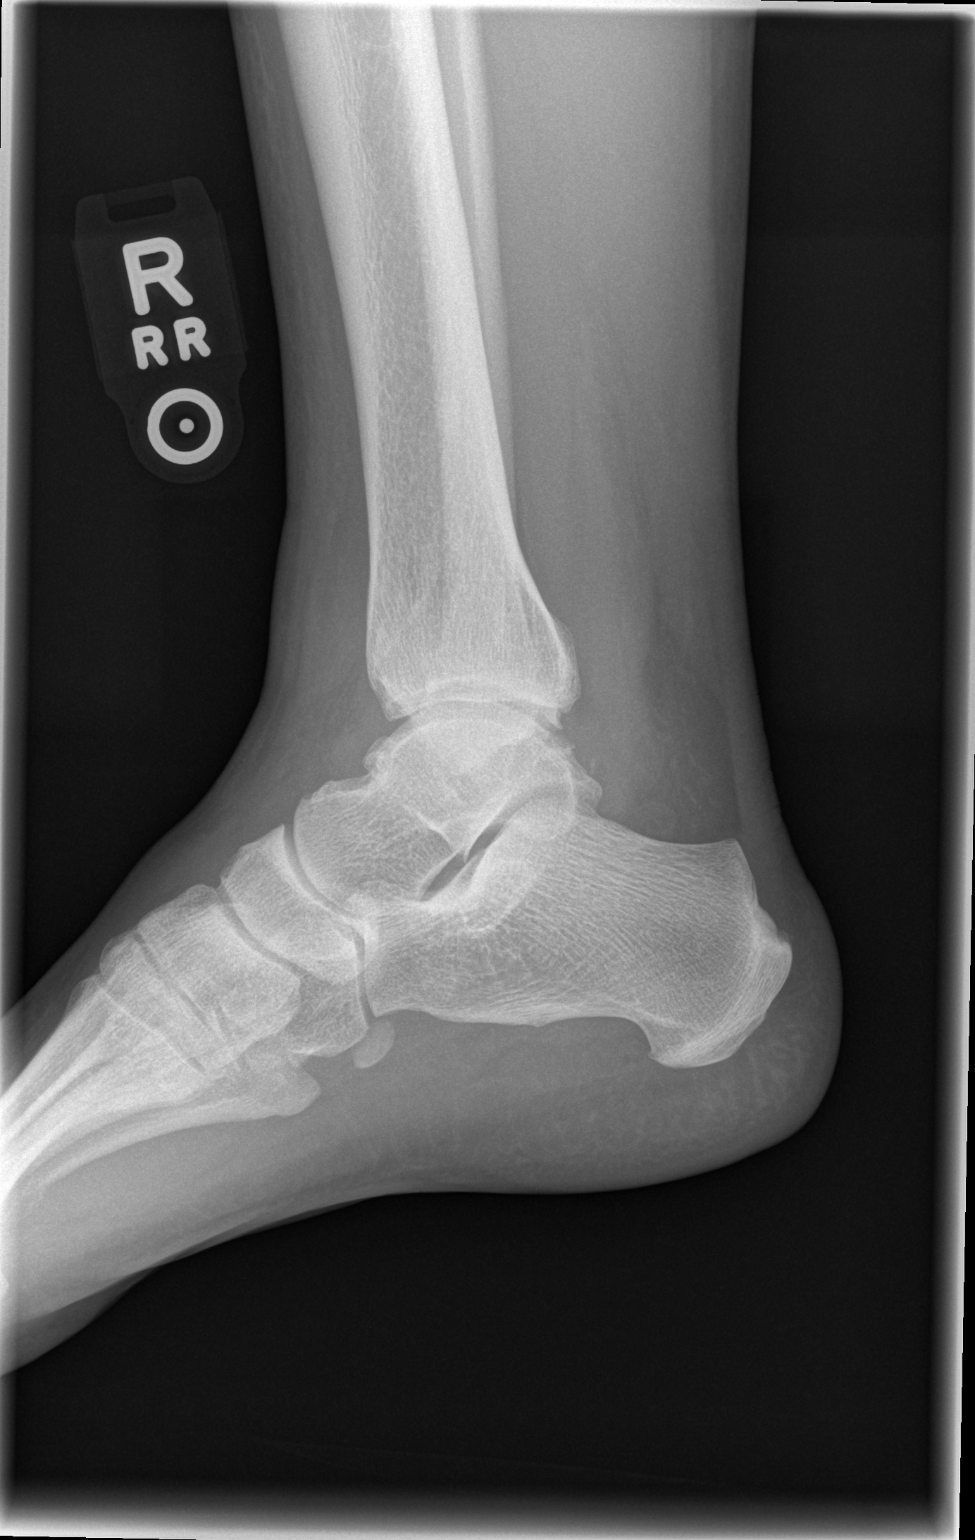

[3 of 3 positions shown; findings below may reference images not displayed]

FINDINGS: Avulsion fracture off the tip of the lateral malleolus. There also
appears to be avulsion off the medial talus. Irregularity along the
medial malleolus and medial talus is felt to be chronic. No
additional acute bony abnormality. Lateral soft tissue swelling.
IMPRESSION: Avulsion fracture off the lateral malleolus.

## 2016-08-14 LAB — VITAMIN D 25 HYDROXY (VIT D DEFICIENCY, FRACTURES): VIT D 25 HYDROXY: 23.5

## 2016-08-14 LAB — BASIC METABOLIC PANEL
BUN: 16 (ref 4–21)
Creatinine: 0.7 (ref <=1.1)
GLUCOSE: 97
Potassium: 4.5 (ref 3.4–5.3)
Sodium: 139 (ref 137–147)

## 2016-08-14 LAB — HEPATIC FUNCTION PANEL
ALK PHOS: 46 (ref 25–125)
ALT: 51 — AB (ref 7–35)
AST: 29 (ref 13–35)
BILIRUBIN, TOTAL: 0.4

## 2016-08-14 LAB — CBC AND DIFFERENTIAL
HCT: 43 (ref 36–46)
Hemoglobin: 14.2 (ref 12.0–16.0)
PLATELETS: 313 (ref 150–399)
WBC: 8.8

## 2016-08-14 LAB — TSH: TSH: 1.04 (ref <=5.90)

## 2016-08-14 LAB — LIPID PANEL
Cholesterol: 192 (ref 0–200)
HDL: 36 (ref 35–70)
LDL CALC: 110
Triglycerides: 232 — AB (ref 40–160)

## 2018-02-21 LAB — HEPATIC FUNCTION PANEL
ALT: 41 — AB (ref 7–35)
AST: 24 (ref 13–35)
Alkaline Phosphatase: 38 (ref 25–125)
Bilirubin, Total: 0.4

## 2018-02-21 LAB — BASIC METABOLIC PANEL
BUN: 13 (ref 4–21)
Creatinine: 0.7 (ref <=1.1)
GLUCOSE: 97
POTASSIUM: 4.3 (ref 3.4–5.3)
SODIUM: 137 (ref 137–147)

## 2018-02-21 LAB — CBC AND DIFFERENTIAL
HCT: 42 (ref 36–46)
Hemoglobin: 14.5 (ref 12.0–16.0)
PLATELETS: 303 (ref 150–399)
WBC: 7.6

## 2018-02-21 LAB — LIPID PANEL
Cholesterol: 182 (ref 0–200)
HDL: 35 (ref 35–70)
LDL CALC: 100
Triglycerides: 233 — AB (ref 40–160)

## 2018-02-21 LAB — TSH: TSH: 0.74 (ref <=5.90)

## 2018-02-21 LAB — VITAMIN D 25 HYDROXY (VIT D DEFICIENCY, FRACTURES): Vit D, 25-Hydroxy: 31

## 2018-02-28 DIAGNOSIS — E782 Mixed hyperlipidemia: Secondary | ICD-10-CM | POA: Insufficient documentation

## 2018-03-08 ENCOUNTER — Ambulatory Visit: Payer: Self-pay | Admitting: Family Medicine

## 2018-06-26 ENCOUNTER — Ambulatory Visit (INDEPENDENT_AMBULATORY_CARE_PROVIDER_SITE_OTHER): Payer: BC Managed Care – PPO | Admitting: Adult Health

## 2018-06-26 ENCOUNTER — Encounter: Payer: Self-pay | Admitting: Adult Health

## 2018-06-26 VITALS — BP 124/81 | HR 55 | Ht 64.0 in | Wt 184.4 lb

## 2018-06-26 DIAGNOSIS — Z6831 Body mass index (BMI) 31.0-31.9, adult: Secondary | ICD-10-CM | POA: Diagnosis not present

## 2018-06-26 DIAGNOSIS — Z0000 Encounter for general adult medical examination without abnormal findings: Secondary | ICD-10-CM | POA: Insufficient documentation

## 2018-06-26 DIAGNOSIS — Z6833 Body mass index (BMI) 33.0-33.9, adult: Secondary | ICD-10-CM | POA: Insufficient documentation

## 2018-06-26 NOTE — Assessment & Plan Note (Signed)
Body mass index is 31.65 kg/m.  Current wt 184 Goal wt 140 She is walking 1 hr 4 days/week Currently following Keto Diet

## 2018-06-26 NOTE — Patient Instructions (Signed)
Mediterranean Diet A Mediterranean diet refers to food and lifestyle choices that are based on the traditions of countries located on the [place]. This way of eating has been shown to help prevent certain conditions and improve outcomes for people who have chronic diseases, like kidney disease and heart disease. What are tips for following this plan? Lifestyle  Cook and eat meals together with your family, when possible.  Drink enough fluid to keep your urine clear or pale yellow.  Be physically active every day. This includes: ? Aerobic exercise like running or swimming. ? Leisure activities like gardening, walking, or housework.  Get 7-8 hours of sleep each night.  If recommended by your health care provider, drink red wine in moderation. This means 1 glass a day for nonpregnant women and 2 glasses a day for men. A glass of wine equals 5 oz (150 mL). Reading food labels  Check the serving size of packaged foods. For foods such as rice and pasta, the serving size refers to the amount of cooked product, not dry.  Check the total fat in packaged foods. Avoid foods that have saturated fat or trans fats.  Check the ingredients list for added sugars, such as corn syrup. Shopping  At the grocery store, buy most of your food from the areas near the walls of the store. This includes: ? Fresh fruits and vegetables (produce). ? Grains, beans, nuts, and seeds. Some of these may be available in unpackaged forms or large amounts (in bulk). ? Fresh seafood. ? Poultry and eggs. ? Low-fat dairy products.  Buy whole ingredients instead of prepackaged foods.  Buy fresh fruits and vegetables in-season from local farmers markets.  Buy frozen fruits and vegetables in resealable bags.  If you do not have access to quality fresh seafood, buy precooked frozen shrimp or canned fish, such as tuna, salmon, or sardines.  Buy small amounts of raw or cooked vegetables, salads, or olives from the  deli or salad bar at your store.  Stock your pantry so you always have certain foods on hand, such as olive oil, canned tuna, canned tomatoes, rice, pasta, and beans. Cooking  Cook foods with extra-virgin olive oil instead of using butter or other vegetable oils.  Have meat as a side dish, and have vegetables or grains as your main dish. This means having meat in small portions or adding small amounts of meat to foods like pasta or stew.  Use beans or vegetables instead of meat in common dishes like chili or lasagna.  Experiment with different cooking methods. Try roasting or broiling vegetables instead of steaming or sauteing them.  Add frozen vegetables to soups, stews, pasta, or rice.  Add nuts or seeds for added healthy fat at each meal. You can add these to yogurt, salads, or vegetable dishes.  Marinate fish or vegetables using olive oil, lemon juice, garlic, and fresh herbs. Meal planning  Plan to eat 1 vegetarian meal one day each week. Try to work up to 2 vegetarian meals, if possible.  Eat seafood 2 or more times a week.  Have healthy snacks readily available, such as: ? Vegetable sticks with hummus. ? Greek yogurt. ? Fruit and nut trail mix.  Eat balanced meals throughout the week. This includes: ? Fruit: 2-3 servings a day ? Vegetables: 4-5 servings a day ? Low-fat dairy: 2 servings a day ? Fish, poultry, or lean meat: 1 serving a day ? Beans and legumes: 2 or more servings a week ? Nuts   and seeds: 1-2 servings a day ? Whole grains: 6-8 servings a day ? Extra-virgin olive oil: 3-4 servings a day  Limit red meat and sweets to only a few servings a month What are my food choices?  Mediterranean diet ? Recommended ? Grains: Whole-grain pasta. Brown rice. Bulgar wheat. Polenta. Couscous. Whole-wheat bread. [place]. ? Vegetables: Artichokes. Beets. Broccoli. Cabbage. Carrots. Eggplant. Green beans. Chard. Kale. Spinach. Onions. Leeks. Peas. Squash.  Tomatoes. Peppers. Radishes. ? Fruits: Apples. Apricots. Avocado. Berries. Bananas. Cherries. Dates. Figs. Grapes. Lemons. Melon. Oranges. Peaches. Plums. Pomegranate. ? Meats and other protein foods: Beans. Almonds. Sunflower seeds. Pine nuts. Peanuts. [place]. Salmon. Scallops. Shrimp. [place]. Tilapia. Clams. Oysters. Eggs. ? Dairy: Low-fat milk. Cheese. Greek yogurt. ? Beverages: Water. Red wine. Herbal tea. ? Fats and oils: Extra virgin olive oil. Avocado oil. Grape seed oil. ? Sweets and desserts: [place] yogurt with honey. Baked apples. Poached pears. Trail mix. ? Seasoning and other foods: Basil. Cilantro. Coriander. Cumin. Mint. Parsley. Sage. Rosemary. Tarragon. Garlic. Oregano. Thyme. Pepper. Balsalmic vinegar. Tahini. Hummus. Tomato sauce. Olives. Mushrooms. ? Limit these ? Grains: Prepackaged pasta or rice dishes. Prepackaged cereal with added sugar. ? Vegetables: Deep fried potatoes (french fries). ? Fruits: Fruit canned in syrup. ? Meats and other protein foods: Beef. Pork. Lamb. Poultry with skin. Hot dogs. Berniece Salines. ? Dairy: Ice cream. Sour cream. Whole milk. ? Beverages: Juice. Sugar-sweetened soft drinks. Beer. Liquor and spirits. ? Fats and oils: Butter. Canola oil. Vegetable oil. Beef fat (tallow). Lard. ? Sweets and desserts: Cookies. Cakes. Pies. Candy. ? Seasoning and other foods: Mayonnaise. Premade sauces and marinades. ? The items listed may not be a complete list. Talk with your dietitian about what dietary choices are right for you. Summary  The Mediterranean diet includes both food and lifestyle choices.  Eat a variety of fresh fruits and vegetables, beans, nuts, seeds, and whole grains.  Limit the amount of red meat and sweets that you eat.  Talk with your health care provider about whether it is safe for you to drink red wine in moderation. This means 1 glass a day for nonpregnant women and 2 glasses a day for men. A glass of wine equals 5 oz (150 mL). This information  is not intended to replace advice given to you by your health care provider. Make sure you discuss any questions you have with your health care provider. Document Released: 05/27/2016 Document Revised: 06/29/2016 Document Reviewed: 05/27/2016 Elsevier Interactive Patient Education  2018 [employer].   Exercising to [employer] Exercising can help you to lose weight. In order to lose weight through exercise, you need to do vigorous-intensity exercise. You can tell that you are exercising with vigorous intensity if you are breathing very hard and fast and cannot hold a conversation while exercising. Moderate-intensity exercise helps to maintain your current weight. You can tell that you are exercising at a moderate level if you have a higher heart rate and faster breathing, but you are still able to hold a conversation. How often should I exercise? Choose an activity that you enjoy and set realistic goals. Your health care provider can help you to make an activity plan that works for you. Exercise regularly as directed by your health care provider. This may include:  Doing resistance training twice each week, such as: ? Push-ups. ? Sit-ups. ? Lifting weights. ? Using resistance bands.  Doing a given intensity of exercise for a given amount of time. Choose from these options: ? 150  minutes of moderate-intensity exercise every week. ? 75 minutes of vigorous-intensity exercise every week. ? A mix of moderate-intensity and vigorous-intensity exercise every week.  Children, pregnant women, people who are out of shape, people who are overweight, and older adults may need to consult a health care provider for individual recommendations. If you have any sort of medical condition, be sure to consult your health care provider before starting a new exercise program. What are some activities that can help me to lose weight?  Walking at a rate of at least 4.5 miles an hour.  Jogging or running at a  rate of 5 miles per hour.  Biking at a rate of at least 10 miles per hour.  Lap swimming.  Roller-skating or in-line skating.  Cross-country skiing.  Vigorous competitive sports, such as football, basketball, and soccer.  Jumping rope.  Aerobic dancing. How can I be more active in my day-to-day activities?  Use the stairs instead of the elevator.  Take a walk during your lunch break.  If you drive, park your car farther away from work or school.  If you take public transportation, get off one stop early and walk the rest of the way.  Make all of your phone calls while standing up and walking around.  Get up, stretch, and walk around every 30 minutes throughout the day. What guidelines should I follow while exercising?  Do not exercise so much that you hurt yourself, feel dizzy, or get very short of breath.  Consult your health care provider prior to starting a new exercise program.  Wear comfortable clothes and shoes with good support.  Drink plenty of water while you exercise to prevent dehydration or heat stroke. Body water is lost during exercise and must be replaced.  Work out until you breathe faster and your heart beats faster. This information is not intended to replace advice given to you by your health care provider. Make sure you discuss any questions you have with your health care provider. Document Released: 11/06/2010 Document Revised: 03/11/2016 Document Reviewed: 03/07/2014 Elsevier Interactive Patient Education  2018 [employer].  Continue healthy eating and excellent water intake. Continue your excellent walking program! Recommend physical with fasting labs in 9 months (please schedule fasting labs a week prior to physical appt). WELCOME TO THE PRACTICE!

## 2018-06-26 NOTE — Progress Notes (Signed)
Subjective:    Patient ID: Marisa Chen, female    DOB: May 17, 1976, 42 y.o.   MRN: [id number]  HPI:  Marisa Chen is here to establish as a new pt.  She is a pleasant 42 year old female.   PMH: Elevated Lipids and Migraine HA. She reports not having a single HA in >1 year. She denies daily rx medications. She has been walking 1 hour 4 days/week for the last month. She started "Keto Diet" >1 week ago and has lost 5 lbs. She denies tobacco use and seldom drinks ETOH She has sig family hx of CAD- both grandfathers passed from CAD in their 80s  Her father passed away 2 years ago from complications of T2D, CAD, AAA Her goal is to loss >40 lbs  Patient Care Team    Relationship Specialty Notifications Start End  Julaine Fusi, NP PCP - General Family Medicine  06/26/18     Patient Active Problem List   Diagnosis Date Noted  . Healthcare maintenance 06/26/2018  . BMI 31.0-31.9,adult 06/26/2018  . Right ankle pain 05/31/2014  . Skin lesion of chest wall 07/14/2013     Past Medical History:  Diagnosis Date  . Allergy   . Cholelithiasis   . Decreased libido   . FHx: migraine headaches   . Hyperlipidemia   . Overweight(278.02)      Past Surgical History:  Procedure Laterality Date  . ABDOMINAL HYSTERECTOMY    . BLADDER SUSPENSION    . CHOLECYSTECTOMY  03/15/2012  . TONSILLECTOMY       Family History  Problem Relation Age of Onset  . Heart disease Mother   . Hyperlipidemia Mother   . Asthma Sister   . Cancer Maternal Grandmother        Breast Cancer  . Heart disease Maternal Grandfather   . Heart attack Maternal Grandfather   . Hyperlipidemia Maternal Grandfather   . Hypertension Maternal Grandfather   . Heart attack Father   . Diabetes Father   . Hyperlipidemia Father   . Hypertension Father   . Diabetes Paternal Grandmother   . Heart attack Paternal Grandfather   . Hyperlipidemia Paternal Grandfather      Social History   Substance and Sexual Activity   Drug Use No     Social History   Substance and Sexual Activity  Alcohol Use Yes   Comment: occassionally     Social History   Tobacco Use  Smoking Status Never Smoker  Smokeless Tobacco Never Used     Outpatient Encounter Medications as of 06/26/2018  Medication Sig  . Cholecalciferol (VITAMIN D3) 1000 units CAPS Take 1 capsule by mouth daily.  . Multiple Vitamins-Minerals (HAIR SKIN AND NAILS FORMULA PO) Take 1 tablet by mouth daily.  Marland Kitchen RA KRILL OIL 500 MG CAPS Take 1 capsule by mouth daily.  . vitamin E 400 UNIT capsule Take 1 capsule by mouth daily.  . [DISCONTINUED] Phendimetrazine Tartrate 35 MG TABS Take 1 tablet (35 mg total) by mouth 3 (three) times daily before meals.  . [DISCONTINUED] topiramate (TOPAMAX) 50 MG tablet Take 1 tablet (50 mg total) by mouth 2 (two) times daily.   No facility-administered encounter medications on file as of 06/26/2018.     Allergies: Bee venom; Ciprofloxacin; and Penicillins  Body mass index is 31.65 kg/m.  Blood pressure 124/81, pulse (!) 55, height 5\' 4"  (1.626 m), weight 184 lb 6.4 oz (83.6 kg), SpO2 97 %.  Review of Systems  Constitutional: Positive  for fatigue. Negative for activity change, appetite change, chills, diaphoresis, fever and unexpected weight change.  HENT: Negative for congestion.   Eyes: Negative for visual disturbance.  Respiratory: Negative for cough, chest tightness, shortness of breath, wheezing and stridor.   Cardiovascular: Negative for chest pain, palpitations and leg swelling.  Gastrointestinal: Negative for abdominal distention, abdominal pain, blood in stool, constipation, diarrhea, nausea and vomiting.  Endocrine: Negative for cold intolerance, heat intolerance, polydipsia, polyphagia and polyuria.  Genitourinary: Negative for difficulty urinating and flank pain.  Musculoskeletal: Negative for arthralgias, back pain, gait problem, joint swelling, myalgias, neck pain and neck stiffness.  Skin:  Negative for color change, pallor, rash and wound.  Neurological: Negative for dizziness and headaches.  Hematological: Does not bruise/bleed easily.  Psychiatric/Behavioral: Negative for behavioral problems, confusion, decreased concentration, dysphoric mood, hallucinations, self-injury, sleep disturbance and suicidal ideas. The patient is not nervous/anxious and is not hyperactive.        Objective:   Physical Exam  Constitutional: She is oriented to person, place, and time. She appears well-developed and well-nourished. No distress.  HENT:  Head: Normocephalic and atraumatic.  Right Ear: External ear normal.  Left Ear: External ear normal.  Nose: Nose normal.  Mouth/Throat: Oropharynx is clear and moist.  Eyes: Pupils are equal, round, and reactive to light. Conjunctivae and EOM are normal.  Cardiovascular: Normal rate, regular rhythm, normal heart sounds and intact distal pulses.  No murmur heard. Pulmonary/Chest: Effort normal and breath sounds normal. No stridor. No respiratory distress. She has no wheezes. She has no rales. She exhibits no tenderness.  Neurological: She is alert and oriented to person, place, and time.  Skin: Skin is warm and dry. Capillary refill takes less than 2 seconds. No rash noted. She is not diaphoretic. No erythema. No pallor.  Psychiatric: She has a normal mood and affect. Her behavior is normal. Judgment and thought content normal.  Nursing note and vitals reviewed.     Assessment & Plan:   1. Healthcare maintenance   2. BMI 31.0-31.9,adult     Healthcare maintenance Continue healthy eating and excellent water intake. Continue your excellent walking program! Recommend physical with fasting labs in 9 months (please schedule fasting labs a week prior to physical appt)  BMI 31.0-31.9,adult Body mass index is 31.65 kg/m.  Current wt 184 Goal wt 140 She is walking 1 hr 4 days/week Currently following Keto Diet    FOLLOW-UP:  Return in  about 9 months (around 03/27/2019).

## 2018-06-26 NOTE — Assessment & Plan Note (Signed)
Continue healthy eating and excellent water intake. Continue your excellent walking program! Recommend physical with fasting labs in 9 months (please schedule fasting labs a week prior to physical appt)

## 2018-12-04 ENCOUNTER — Encounter: Payer: Self-pay | Admitting: Adult Health

## 2018-12-04 ENCOUNTER — Ambulatory Visit: Payer: BC Managed Care – PPO | Admitting: Adult Health

## 2018-12-04 VITALS — BP 132/84 | HR 62 | Temp 98.1°F | Ht 64.0 in | Wt 191.4 lb

## 2018-12-04 DIAGNOSIS — J0100 Acute maxillary sinusitis, unspecified: Secondary | ICD-10-CM | POA: Diagnosis not present

## 2018-12-04 DIAGNOSIS — R6889 Other general symptoms and signs: Secondary | ICD-10-CM | POA: Diagnosis not present

## 2018-12-04 DIAGNOSIS — J029 Acute pharyngitis, unspecified: Secondary | ICD-10-CM | POA: Diagnosis not present

## 2018-12-04 LAB — POCT INFLUENZA A/B
INFLUENZA A, POC: NEGATIVE
INFLUENZA B, POC: NEGATIVE

## 2018-12-04 MED ORDER — PREDNISONE 20 MG PO TABS: ORAL_TABLET | ORAL | 0 refills | Status: DC

## 2018-12-04 MED ORDER — FLUTICASONE PROPIONATE 50 MCG/ACT NA SUSP: 2.0000 | Freq: Every day | NASAL | 2 refills | Status: DC

## 2018-12-04 NOTE — Patient Instructions (Signed)
Pharyngitis  Pharyngitis is redness, pain, and swelling (inflammation) of the throat (pharynx). It is a very common cause of sore throat. Pharyngitis can be caused by a bacteria, but it is usually caused by a virus. Most cases of pharyngitis get better on their own without treatment. What are the causes? This condition may be caused by:  Infection by viruses (viral). Viral pharyngitis spreads from person to person (is contagious) through coughing, sneezing, and sharing of personal items or utensils such as cups, forks, spoons, and toothbrushes.  Infection by bacteria (bacterial). Bacterial pharyngitis may be spread by touching the nose or face after coming in contact with the bacteria, or through more intimate contact, such as kissing.  Allergies. Allergies can cause buildup of mucus in the throat (post-nasal drip), leading to inflammation and irritation. Allergies can also cause blocked nasal passages, forcing breathing through the mouth, which dries and irritates the throat. What increases the risk? You are more likely to develop this condition if:  You are 5-24 years old.  You are exposed to crowded environments such as daycare, school, or dormitory living.  You live in a cold climate.  You have a weakened disease-fighting (immune) system. What are the signs or symptoms? Symptoms of this condition vary by the cause (viral, bacterial, or allergies) and can include:  Sore throat.  Fatigue.  Low-grade fever.  Headache.  Joint pain and muscle aches.  Skin rashes.  Swollen glands in the throat (lymph nodes).  Plaque-like film on the throat or tonsils. This is often a symptom of bacterial pharyngitis.  Vomiting.  Stuffy nose (nasal congestion).  Cough.  Red, itchy eyes (conjunctivitis).  Loss of appetite. How is this diagnosed? This condition is often diagnosed based on your medical history and a physical exam. Your health care provider will ask you questions about your  illness and your symptoms. A swab of your throat may be done to check for bacteria (rapid strep test). Other lab tests may also be done, depending on the suspected cause, but these are rare. How is this treated? This condition usually gets better in 3-4 days without medicine. Bacterial pharyngitis may be treated with antibiotic medicines. Follow these instructions at home:  Take over-the-counter and prescription medicines only as told by your health care provider. ? If you were prescribed an antibiotic medicine, take it as told by your health care provider. Do not stop taking the antibiotic even if you start to feel better. ? Do not give children aspirin because of the association with Reye syndrome.  Drink enough water and fluids to keep your urine clear or pale yellow.  Get a lot of rest.  Gargle with a salt-water mixture 3-4 times a day or as needed. To make a salt-water mixture, completely dissolve -1 tsp of salt in 1 cup of warm water.  If your health care provider approves, you may use throat lozenges or sprays to soothe your throat. Contact a health care provider if:  You have large, tender lumps in your neck.  You have a rash.  You cough up green, yellow-brown, or bloody spit. Get help right away if:  Your neck becomes stiff.  You drool or are unable to swallow liquids.  You cannot drink or take medicines without vomiting.  You have severe pain that does not go away, even after you take medicine.  You have trouble breathing, and it is not caused by a stuffy nose.  You have new pain and swelling in your joints such as   the knees, ankles, wrists, or elbows. Summary  Pharyngitis is redness, pain, and swelling (inflammation) of the throat (pharynx).  While pharyngitis can be caused by a bacteria, the most common causes are viral.  Most cases of pharyngitis get better on their own without treatment.  Bacterial pharyngitis is treated with antibiotic medicines. This  information is not intended to replace advice given to you by your health care provider. Make sure you discuss any questions you have with your health care provider. Document Released: 10/04/2005 Document Revised: 11/09/2016 Document Reviewed: 11/09/2016 Elsevier Interactive Patient Education  2019 [employer]  Flu Test Negative Increase fluids/rest/vit c-2,000mg /day when not feeling well. Alternate OTC Acetaminophen and OTC Ibuprofen for fever/discomfort. Please use Prednisone taper and Flonase as directed. If you are still experiencing symptoms into next week, please call clinic and we will send in Doxycycline. Work Excuse provided, okay to return Wed 12/06/2018. FEEL BETTER!

## 2018-12-04 NOTE — Assessment & Plan Note (Signed)
Flu Test Negative Increase fluids/rest/vit c-2,000mg /day when not feeling well. Alternate OTC Acetaminophen and OTC Ibuprofen for fever/discomfort. Please use Prednisone taper and Flonase as directed. If you are still experiencing symptoms into next week, please call clinic and we will send in Doxycycline. Work Excuse provided, okay to return Wed 12/06/2018.

## 2018-12-04 NOTE — Assessment & Plan Note (Signed)
Flu Test Negative Increase fluids/rest/vit c-2,000mg/day when not feeling well. Alternate OTC Acetaminophen and OTC Ibuprofen for fever/discomfort. Please use Prednisone taper and Flonase as directed. If you are still experiencing symptoms into next week, please call clinic and we will send in Doxycycline. Work Excuse provided, okay to return Wed 12/06/2018. 

## 2018-12-04 NOTE — Progress Notes (Signed)
p  Subjective:    Patient ID: Marisa Chen, female    DOB: 05-05-1976, 43 y.o.   MRN: [id number]  HPI:  Marisa Chen presents with sore throat (6/10), copious clear/yellow nasal drainage that started 3 days ago.  Then this morning when she was doing laundry she states "I broke into a sweat, had chills, and became really short of breath which is very unusual for me". She denies fever/N/V/D/cough She denies CP/palpitations She denies hx of asthma or re-current strep infections She denies tobacco/vape use She has been pushing fluids and using OTC DayQuil, last dose this morning- current temp 98.1 f oral She denies any known exposure to the flu, however works in kindergarten class She did not receive influenza vaccination this year  Patient Care Team    Relationship Specialty Notifications Start End  Danford, Jinny Blossom, NP PCP - General Family Medicine  06/26/18     Patient Active Problem List   Diagnosis Date Noted  . Pharyngitis 12/04/2018  . Acute maxillary sinusitis 12/04/2018  . Healthcare maintenance 06/26/2018  . BMI 31.0-31.9,adult 06/26/2018  . Right ankle pain 05/31/2014  . Skin lesion of chest wall 07/14/2013     Past Medical History:  Diagnosis Date  . Allergy   . Cholelithiasis   . Decreased libido   . FHx: migraine headaches   . Hyperlipidemia   . Overweight(278.02)      Past Surgical History:  Procedure Laterality Date  . ABDOMINAL HYSTERECTOMY    . BLADDER SUSPENSION    . CHOLECYSTECTOMY  03/15/2012  . TONSILLECTOMY       Family History  Problem Relation Age of Onset  . Heart disease Mother   . Hyperlipidemia Mother   . Asthma Sister   . Cancer Maternal Grandmother        Breast Cancer  . Heart disease Maternal Grandfather   . Heart attack Maternal Grandfather   . Hyperlipidemia Maternal Grandfather   . Hypertension Maternal Grandfather   . Heart attack Father   . Diabetes Father   . Hyperlipidemia Father   . Hypertension Father   . Diabetes  Paternal Grandmother   . Heart attack Paternal Grandfather   . Hyperlipidemia Paternal Grandfather      Social History   Substance and Sexual Activity  Drug Use No     Social History   Substance and Sexual Activity  Alcohol Use Yes   Comment: occassionally     Social History   Tobacco Use  Smoking Status Never Smoker  Smokeless Tobacco Never Used     Outpatient Encounter Medications as of 12/04/2018  Medication Sig  . Cholecalciferol (VITAMIN D3) 1000 units CAPS Take 1 capsule by mouth daily.  . Multiple Vitamins-Minerals (HAIR SKIN AND NAILS FORMULA PO) Take 1 tablet by mouth daily.  Marland Kitchen RA KRILL OIL 500 MG CAPS Take 1 capsule by mouth daily.  . vitamin E 400 UNIT capsule Take 1 capsule by mouth daily.  . fluticasone (FLONASE) 50 MCG/ACT nasal spray Place 2 sprays into both nostrils daily.  . predniSONE (DELTASONE) 20 MG tablet One tab every 12 hrs for 3 days, then one tab daily for 3 days   No facility-administered encounter medications on file as of 12/04/2018.     Allergies: Bee venom; Ciprofloxacin; and Penicillins  Body mass index is 32.85 kg/m.  Blood pressure 132/84, pulse 62, temperature 98.1 F (36.7 C), temperature source Oral, height 5\' 4"  (1.626 m), weight 191 lb 6.4 oz (86.8 kg), SpO2 98 %.  Review of Systems  Constitutional: Positive for activity change, appetite change, chills, diaphoresis and fatigue. Negative for fever and unexpected weight change.  HENT: Positive for congestion, postnasal drip, rhinorrhea, sinus pressure, sore throat and voice change. Negative for sinus pain and trouble swallowing.   Eyes: Negative for visual disturbance.  Respiratory: Negative for cough, chest tightness and wheezing.   Cardiovascular: Negative for chest pain, palpitations and leg swelling.  Gastrointestinal: Negative for abdominal distention, abdominal pain, blood in stool, constipation, diarrhea, nausea and vomiting.  Endocrine: Negative for cold intolerance,  heat intolerance, polydipsia, polyphagia and polyuria.  Genitourinary: Negative for difficulty urinating and flank pain.  Musculoskeletal: Negative for arthralgias, back pain, gait problem, joint swelling, myalgias, neck pain and neck stiffness.  Skin: Negative for color change, pallor, rash and wound.  Neurological: Negative for dizziness and headaches.  Hematological: Does not bruise/bleed easily.  Psychiatric/Behavioral: Positive for sleep disturbance.       Objective:   Physical Exam Vitals signs and nursing note reviewed.  Constitutional:      General: She is not in acute distress.    Appearance: Normal appearance. She is not ill-appearing, toxic-appearing or diaphoretic.  HENT:     Head: Normocephalic and atraumatic.     Right Ear: No decreased hearing noted. Tympanic membrane is bulging. Tympanic membrane is not erythematous.     Left Ear: No decreased hearing noted. Tympanic membrane is bulging. Tympanic membrane is not erythematous.     Nose:     Right Turbinates: Swollen.     Left Turbinates: Swollen.     Right Sinus: Maxillary sinus tenderness present. No frontal sinus tenderness.     Left Sinus: Maxillary sinus tenderness present. No frontal sinus tenderness.     Mouth/Throat:     Pharynx: Pharyngeal swelling and posterior oropharyngeal erythema present. No oropharyngeal exudate.     Tonsils: No tonsillar exudate or tonsillar abscesses. Swelling: 1+ on the right. 1+ on the left.  Cardiovascular:     Rate and Rhythm: Normal rate.     Pulses: Normal pulses.     Heart sounds: Normal heart sounds. No murmur. No friction rub. No gallop.   Pulmonary:     Effort: Pulmonary effort is normal. No respiratory distress.     Breath sounds: Normal breath sounds. No stridor. No wheezing, rhonchi or rales.  Chest:     Chest wall: No tenderness.  Skin:    General: Skin is warm and dry.     Capillary Refill: Capillary refill takes less than 2 seconds.  Neurological:     Mental  Status: She is alert and oriented to person, place, and time.  Psychiatric:        Mood and Affect: Mood normal.        Behavior: Behavior normal.        Thought Content: Thought content normal.        Judgment: Judgment normal.       Assessment & Plan:   1. Pharyngitis, unspecified etiology   2. Acute maxillary sinusitis, recurrence not specified   3. Flu-like symptoms     Pharyngitis Flu Test Negative Increase fluids/rest/vit c-2,000mg /day when not feeling well. Alternate OTC Acetaminophen and OTC Ibuprofen for fever/discomfort. Please use Prednisone taper and Flonase as directed. If you are still experiencing symptoms into next week, please call clinic and we will send in Doxycycline. Work Excuse provided, okay to return Wed 12/06/2018.  Acute maxillary sinusitis Flu Test Negative Increase fluids/rest/vit c-2,000mg /day when not feeling well. Alternate OTC  Acetaminophen and OTC Ibuprofen for fever/discomfort. Please use Prednisone taper and Flonase as directed. If you are still experiencing symptoms into next week, please call clinic and we will send in Doxycycline. Work Excuse provided, okay to return Wed 12/06/2018.  FOLLOW-UP:  Return if symptoms worsen or fail to improve.

## 2020-06-24 ENCOUNTER — Ambulatory Visit: Payer: Self-pay

## 2020-07-02 ENCOUNTER — Other Ambulatory Visit: Payer: Self-pay

## 2020-07-02 ENCOUNTER — Other Ambulatory Visit: Payer: BC Managed Care – PPO

## 2020-07-02 DIAGNOSIS — Z20822 Contact with and (suspected) exposure to covid-19: Secondary | ICD-10-CM

## 2020-07-05 LAB — NOVEL CORONAVIRUS, NAA: SARS-CoV-2, NAA: NOT DETECTED

## 2020-09-16 ENCOUNTER — Other Ambulatory Visit: Payer: Self-pay

## 2020-09-16 ENCOUNTER — Ambulatory Visit: Payer: BC Managed Care – PPO | Admitting: Physician Assistant

## 2020-09-16 ENCOUNTER — Encounter: Payer: Self-pay | Admitting: Physician Assistant

## 2020-09-16 VITALS — BP 152/85 | HR 76 | Ht 63.5 in | Wt 180.0 lb

## 2020-09-16 DIAGNOSIS — J069 Acute upper respiratory infection, unspecified: Secondary | ICD-10-CM | POA: Diagnosis not present

## 2020-09-16 DIAGNOSIS — R059 Cough, unspecified: Secondary | ICD-10-CM | POA: Diagnosis not present

## 2020-09-16 MED ORDER — BENZONATATE 100 MG PO CAPS: 100.0000 mg | ORAL_CAPSULE | Freq: Three times a day (TID) | ORAL | 0 refills | Status: DC | PRN

## 2020-09-16 MED ORDER — PREDNISONE 10 MG PO TABS: 10.0000 mg | ORAL_TABLET | Freq: Every day | ORAL | 0 refills | Status: DC

## 2020-09-16 MED ORDER — AZITHROMYCIN 250 MG PO TABS: ORAL_TABLET | ORAL | 0 refills | Status: DC

## 2020-09-16 NOTE — Progress Notes (Signed)
Telehealth office visit note for Mayer Masker, PA-C- at Primary Care at [facility]   I connected with current patient today by telephone and verified that I am speaking with the correct person   . Location of the patient: Home . Location of the provider: Office - This visit type was conducted due to national recommendations for restrictions regarding the COVID-19 Pandemic (e.g. social distancing) in an effort to limit this patient's exposure and mitigate transmission in our community.    - No physical exam could be performed with this format, beyond that communicated to [place] by the patient/ family members as noted.   - Additionally my office staff/ schedulers were to discuss with the patient that there may be a monetary charge related to this service, depending on their medical insurance.  My understanding is that patient understood and consented to proceed.     _________________________________________________________________________________   History of Present Illness: Patient calls in with upper respiratory symptoms that have been ongoing for approximately 12 days. Symptoms include runny nose with thick nasal mucus, chest congestion, postnasal drainage, chest heaviness, cough that sometimes is dry and other times productive. States cough is worse in the mornings and at night. Did have a headache for days but not today. Has been taking OTC cold and flu medication which has provided some relief. Did a home Covid test which resulted negative. Has been fully vaccinated against Covid-19.  Denies fever, chills or exposures to sick contacts.      No flowsheet data found.  Depression screen [facility] 2/9 09/16/2020 12/04/2018 06/26/2018  Decreased Interest 0 0 0  Down, Depressed, Hopeless 0 0 0  PHQ - 2 Score 0 0 0  Altered sleeping 0 0 0  Tired, decreased energy 2 0 0  Change in appetite 0 0 0  Feeling bad or failure about yourself  0 0 0  Trouble concentrating 0 0 0  Moving slowly or  fidgety/restless 0 0 0  Suicidal thoughts 0 0 0  PHQ-9 Score 2 0 0  Difficult doing work/chores Not difficult at all - -      Impression and Recommendations:     1. Upper respiratory tract infection, unspecified type   2. Cough     Upper respiratory tract infection, unspecified type; Cough: -Symptoms have been ongoing for >10 days so will start antibiotic therapy with azithromycin and Tessalon Perles to help with cough. Will start low-dose of prednisone to help with chest congestion and reduce inflammation. -Recommend to continue with home supportive therapy, drink warm liquids/honey and take Tylenol or ibuprofen as needed for pain relief. -Discussed with patient red flag signs and symptoms to monitor for and recommend to seek immediate medical care. -If symptoms fail to improve or worsen recommend retesting for Covid infection and flu test as well as chest x-ray.     - As part of my medical decision making, I reviewed the following data within the electronic MEDICAL RECORD NUMBER History obtained from pt /family, CMA notes reviewed and incorporated if applicable, Labs reviewed, Radiograph/ tests reviewed if applicable and OV notes from prior OV's with me, as well as any other specialists she/he has seen since seeing me last, were all reviewed and used in my medical decision making process today.    - Additionally, when appropriate, discussion had with patient regarding our treatment plan, and their biases/concerns about that plan were used in my medical decision making today.    - The patient agreed with  the plan and demonstrated an understanding of the instructions.   No barriers to understanding were identified.     - The patient was advised to call back or seek an in-person evaluation if the symptoms worsen or if the condition fails to improve as anticipated.   Return if symptoms worsen or fail to improve.    No orders of the defined types were placed in this encounter.   Meds  ordered this encounter  Medications  . azithromycin (ZITHROMAX) 250 MG tablet    Sig: Take 2 tablets by mouth on day 1. Then take 1 tablet by mouth once daily x 4 days.    Dispense:  6 tablet    Refill:  0    Order Specific Question:   Supervising Provider    Answer:   Nani Gasser D [[id number]]  . benzonatate (TESSALON) 100 MG capsule    Sig: Take 1 capsule (100 mg total) by mouth 3 (three) times daily as needed for cough.    Dispense:  30 capsule    Refill:  0    Order Specific Question:   Supervising Provider    Answer:   Nani Gasser D [[id number]]  . predniSONE (DELTASONE) 10 MG tablet    Sig: Take 1 tablet (10 mg total) by mouth daily with breakfast.    Dispense:  5 tablet    Refill:  0    Order Specific Question:   Supervising Provider    Answer:   Nani Gasser D [[id number]]    Medications Discontinued During This Encounter  Medication Reason  . fluticasone (FLONASE) 50 MCG/ACT nasal spray Completed Course  . Multiple Vitamins-Minerals (HAIR SKIN AND NAILS FORMULA PO)   . predniSONE (DELTASONE) 20 MG tablet Completed Course  . vitamin E 400 UNIT capsule Patient Preference       Time spent on visit including pre-visit chart review and post-visit care was 12 minutes.      The 21st Century Cures Act was signed into law in 2016 which includes the topic of electronic health records.  This provides immediate access to information in MyChart.  This includes consultation notes, operative notes, office notes, lab results and pathology reports.  If you have any questions about what you read please let us know at your next visit or call us at the office.  We are right here with you.  Note:  This note was prepared with assistance of Dragon voice recognition software. Occasional wrong-word or sound-a-like substitutions may have occurred due to the inherent limitations of voice recognition  software.  __________________________________________________________________________________     No care team member to display   -Vitals obtained; medications/ allergies reconciled;  personal medical, social, Sx etc.histories were updated by CMA, reviewed by me and are reflected in chart   Patient Active Problem List   Diagnosis Date Noted  . Pharyngitis 12/04/2018  . Acute maxillary sinusitis 12/04/2018  . Healthcare maintenance 06/26/2018  . BMI 31.0-31.9,adult 06/26/2018  . Right ankle pain 05/31/2014  . Skin lesion of chest wall 07/14/2013     Current Meds  Medication Sig  . Cholecalciferol (VITAMIN D3) 1000 units CAPS Take 1 capsule by mouth daily.  Marland Kitchen RA KRILL OIL 500 MG CAPS Take 1 capsule by mouth daily.     Allergies:  Allergies  Allergen Reactions  . Bee Venom Hives    Burning and Edema  . Ciprofloxacin   . Penicillins      ROS:  See above HPI for pertinent positives and negatives  Objective:   Blood pressure (!) 152/85, pulse 76, height 5' 3.5" (1.613 m), weight 180 lb (81.6 kg).  (if some vitals are omitted, this means that patient was UNABLE to obtain them) General: A & O * 3; sounds in no acute distress; sounds congested Respiratory: speaking in full sentences, no conversational dyspnea Psych: insight appears good, mood- appears full

## 2020-09-23 ENCOUNTER — Telehealth: Payer: Self-pay | Admitting: Physician Assistant

## 2020-09-23 NOTE — Telephone Encounter (Signed)
Patient's back is bothering her, she was on prednisone for an upper raspatory infection. Thanks

## 2020-09-23 NOTE — Telephone Encounter (Signed)
Patient states she is having extreme lower back pain to the point that she is unable to put her pants on without assistance. I advised patient we had no available apts in our office and suggested UC for evaluation and treatment for acute issue. Patient verbalized understanding and was agreeable. AS, CMA

## 2021-04-19 DIAGNOSIS — K9041 Non-celiac gluten sensitivity: Secondary | ICD-10-CM | POA: Insufficient documentation

## 2021-04-19 DIAGNOSIS — R194 Change in bowel habit: Secondary | ICD-10-CM | POA: Insufficient documentation

## 2021-04-19 DIAGNOSIS — R1031 Right lower quadrant pain: Secondary | ICD-10-CM | POA: Insufficient documentation

## 2021-04-19 DIAGNOSIS — K625 Hemorrhage of anus and rectum: Secondary | ICD-10-CM | POA: Insufficient documentation

## 2021-04-19 DIAGNOSIS — K7581 Nonalcoholic steatohepatitis (NASH): Secondary | ICD-10-CM | POA: Insufficient documentation

## 2021-04-19 DIAGNOSIS — R140 Abdominal distension (gaseous): Secondary | ICD-10-CM | POA: Insufficient documentation

## 2021-04-19 DIAGNOSIS — K5900 Constipation, unspecified: Secondary | ICD-10-CM | POA: Insufficient documentation

## 2021-04-19 DIAGNOSIS — R748 Abnormal levels of other serum enzymes: Secondary | ICD-10-CM | POA: Insufficient documentation

## 2021-10-09 ENCOUNTER — Other Ambulatory Visit: Payer: Self-pay

## 2021-10-09 ENCOUNTER — Encounter: Payer: Self-pay | Admitting: Physician Assistant

## 2021-10-09 ENCOUNTER — Ambulatory Visit (INDEPENDENT_AMBULATORY_CARE_PROVIDER_SITE_OTHER): Payer: BC Managed Care – PPO | Admitting: Physician Assistant

## 2021-10-09 VITALS — BP 124/82 | HR 72 | Temp 97.9°F | Ht 64.0 in | Wt 195.0 lb

## 2021-10-09 DIAGNOSIS — N6310 Unspecified lump in the right breast, unspecified quadrant: Secondary | ICD-10-CM

## 2021-10-09 DIAGNOSIS — N644 Mastodynia: Secondary | ICD-10-CM

## 2021-10-09 NOTE — Patient Instructions (Signed)
Breast Tenderness Breast tenderness is a common problem for women of all ages, but may also occur in men. Breast tenderness may range from mild discomfort to severe pain. In women, the pain usually comes and goes with the menstrual cycle, but it can also be constant. Breast tenderness has many possible causes, including hormone changes, infections, and taking certain medicines. You may have tests, such as a mammogram or an ultrasound, to check for any unusual findings. Having breast tenderness usually does not mean that you have breast cancer. Follow these instructions at home: Managing pain and discomfort  If directed, put ice to the painful area. To do this: Put ice in a plastic bag. Place a towel between your skin and the bag. Leave the ice on for 20 minutes, 2-3 times a day. Wear a supportive bra, especially during exercise. You may also want to wear a supportive bra while sleeping if your breasts are very tender. Medicines Take over-the-counter and prescription medicines only as told by your health care provider. If the cause of your pain is infection, you may be prescribed an antibiotic medicine. If you were prescribed an antibiotic, take it as told by your health care provider. Do not stop taking the antibiotic even if you start to feel better. Eating and drinking Your health care provider may recommend that you lessen the amount of fat in your diet. You can do this by: Limiting fried foods. Cooking foods using methods such as baking, boiling, grilling, and broiling. Decrease the amount of caffeine in your diet. Instead, drink more water and choose caffeine-free drinks. General instructions  Keep a log of the days and times when your breasts are most tender. Ask your health care provider how to do breast exams at home. This will help you notice if you have an unusual growth or lump. Keep all follow-up visits as told by your health care provider. This is important. Contact a health care  provider if: Any part of your breast is hard, red, and hot to the touch. This may be a sign of infection. You are a woman and: Not breastfeeding and you have fluid, especially blood or pus, coming out of your nipples. Have a new or painful lump in your breast that remains after your menstrual period ends. You have a fever. Your pain does not improve or it gets worse. Your pain is interfering with your daily activities. Summary Breast tenderness may range from mild discomfort to severe pain. Breast tenderness has many possible causes, including hormone changes, infections, and taking certain medicines. It can be treated with ice, wearing a supportive bra, and medicines. Make changes to your diet if told to by your health care provider. This information is not intended to replace advice given to you by your health care provider. Make sure you discuss any questions you have with your health care provider. Document Revised: 02/26/2019 Document Reviewed: 02/26/2019 Elsevier Patient Education  2022 Elsevier Inc.  

## 2021-10-09 NOTE — Progress Notes (Signed)
Acute Office Visit  Subjective:    Patient ID: Marisa Chen, female    DOB: 1976-07-06, 45 y.o.   MRN: [id number]  Chief Complaint  Patient presents with   Acute Visit    HPI Patient is in today for c/o right breast tenderness, states it feels heavier and swollen. Denies fever, chills, night sweats, redness, nipple changes or drainage. Patient does have a family history of inflammatory breast cancer. She is UTD with her annual screening mammograms.   Past Medical History:  Diagnosis Date   Allergy    Cholelithiasis    Decreased libido    FHx: migraine headaches    Hyperlipidemia    Overweight(278.02)     Past Surgical History:  Procedure Laterality Date   ABDOMINAL HYSTERECTOMY     BLADDER SUSPENSION     CHOLECYSTECTOMY  03/15/2012   TONSILLECTOMY      Family History  Problem Relation Age of Onset   Heart disease Mother    Hyperlipidemia Mother    Asthma Sister    Cancer Maternal Grandmother        Breast Cancer   Heart disease Maternal Grandfather    Heart attack Maternal Grandfather    Hyperlipidemia Maternal Grandfather    Hypertension Maternal Grandfather    Heart attack Father    Diabetes Father    Hyperlipidemia Father    Hypertension Father    Diabetes Paternal Grandmother    Heart attack Paternal Grandfather    Hyperlipidemia Paternal Grandfather     Social History   Socioeconomic History   Marital status: Married    Spouse name: Arboriculturist   Number of children: 3   Years of education: 12   Highest education level: Not on file  Occupational History    Employer: [employer]  Tobacco Use   Smoking status: Never   Smokeless tobacco: Never  Substance and Sexual Activity   Alcohol use: Yes    Comment: occassionally   Drug use: No   Sexual activity: Yes    Birth control/protection: Surgical  Other Topics Concern   Not on file  Social History Narrative   Marital Status: Married IT sales professional)    Children:  Daughters (2) Son (1)     Pets: Cows/ Chickens/ Dogs    Living Situation: Lives with husband and children.   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     Education: Engineer, agricultural   Tobacco Use/Exposure:  None    Alcohol Use:  Occasional   Drug Use:  None   Diet:  Regular   Exercise: Limited    Hobbies: Art/Running               Social Determinants of Corporate investment banker Strain: Not on file  Food Insecurity: Not on file  Transportation Needs: Not on file  Physical Activity: Not on file  Stress: Not on file  Social Connections: Not on file  Intimate Partner Violence: Not on file    Outpatient Medications Prior to Visit  Medication Sig Dispense Refill   amphetamine-dextroamphetamine (ADDERALL XR) 20 MG 24 hr capsule Take 1 capsule by mouth every morning.     Cholecalciferol (VITAMIN D3) 1000 units CAPS Take 1 capsule by mouth daily.     RA KRILL OIL 500 MG CAPS Take 1 capsule by mouth daily.     azithromycin (ZITHROMAX) 250 MG tablet Take 2 tablets by mouth on day 1. Then  take 1 tablet by mouth once daily x 4 days. 6 tablet 0   benzonatate (TESSALON) 100 MG capsule Take 1 capsule (100 mg total) by mouth 3 (three) times daily as needed for cough. 30 capsule 0   predniSONE (DELTASONE) 10 MG tablet Take 1 tablet (10 mg total) by mouth daily with breakfast. 5 tablet 0   No facility-administered medications prior to visit.    Allergies  Allergen Reactions   Bee Venom Hives    Burning and Edema   Ciprofloxacin    Penicillins     Review of Systems Review of Systems:  A fourteen system review of systems was performed and found to be positive as per HPI.    Objective:    Physical Exam General:  Well Developed, well nourished, appropriate for stated age.  Neuro:  Alert and oriented,  extra-ocular muscles intact  HEENT:  Normocephalic, atraumatic, neck supple Skin:  warm, dry. Breast: tenderness at lower and upper outer  quadrants and palpable firm mass at 9 O'clock of right breast, no tenderness of left breast, no skin dimpling or erythema, no nipple discharge or inversion noted b/l Cardiac:  RRR, S1 S2 Respiratory:  CTA B/L Vascular:  Ext warm, no cyanosis apprec.; cap RF less 2 sec. Psych:  No HI/SI, judgement and insight good, Euthymic mood. Full Affect.  BP 124/82    Pulse 72    Temp 97.9 F (36.6 C)    Ht 5\' 4"  (1.626 m)    Wt 195 lb (88.5 kg)    SpO2 98%    BMI 33.47 kg/m  Wt Readings from Last 3 Encounters:  10/09/21 195 lb (88.5 kg)  09/16/20 180 lb (81.6 kg)  12/04/18 191 lb 6.4 oz (86.8 kg)    Health Maintenance Due  Topic Date Due   HIV Screening  Never done   Hepatitis C Screening  Never done   PAP SMEAR-Modifier  01/16/2010   COVID-19 Vaccine (3 - Booster for Pfizer series) 08/14/2020   INFLUENZA VACCINE  Never done    There are no preventive care reminders to display for this patient.   Lab Results  Component Value Date   TSH 0.74 02/21/2018   Lab Results  Component Value Date   WBC 7.6 02/21/2018   HGB 14.5 02/21/2018   HCT 42 02/21/2018   MCV 86.4 03/25/2007   PLT 303 02/21/2018   Lab Results  Component Value Date   NA 137 02/21/2018   K 4.3 02/21/2018   CO2 25 03/25/2007   GLUCOSE 123 (H) 03/25/2007   BUN 13 02/21/2018   CREATININE 0.7 02/21/2018   BILITOT 0.8 03/25/2007   ALKPHOS 38 02/21/2018   AST 24 02/21/2018   ALT 41 (A) 02/21/2018   PROT 7.5 03/25/2007   ALBUMIN 4.1 03/25/2007   CALCIUM 9.2 03/25/2007   Lab Results  Component Value Date   CHOL 182 02/21/2018   Lab Results  Component Value Date   HDL 35 02/21/2018   Lab Results  Component Value Date   LDLCALC 100 02/21/2018   Lab Results  Component Value Date   TRIG 233 (A) 02/21/2018   No results found for: CHOLHDL No results found for: 04/23/2018     Assessment & Plan:   Problem List Items Addressed This Visit   None Visit Diagnoses     Breast pain, right    -  Primary   Relevant  Orders   MM Digital Diagnostic Unilat R   Mass of right breast, unspecified  quadrant       Relevant Orders   MM Digital Diagnostic Unilat R      Right breast pain, Mass of right breast: -Will place order for diagnostic mammogram of right breast for further evaluation.  -Reviewed screening mammogram 03/12/2021.  ACR Breast Density Category c: The breast tissue is heterogeneously  dense, which may obscure small masses.   FINDINGS:  There are no findings suspicious for malignancy. The images were  evaluated with computer-aided detection.   IMPRESSION:  No mammographic evidence of malignancy.   No orders of the defined types were placed in this encounter.    Mayer Masker, PA-C

## 2021-11-29 ENCOUNTER — Emergency Department (HOSPITAL_COMMUNITY)
Admission: EM | Admit: 2021-11-29 | Discharge: 2021-11-29 | Disposition: A | Discharge status: Home / Self Care | Payer: BC Managed Care – PPO | Attending: Emergency Medicine | Admitting: Emergency Medicine

## 2021-11-29 ENCOUNTER — Encounter (HOSPITAL_COMMUNITY): Payer: Self-pay | Admitting: Emergency Medicine

## 2021-11-29 ENCOUNTER — Encounter: Payer: Self-pay | Admitting: Emergency Medicine

## 2021-11-29 ENCOUNTER — Other Ambulatory Visit: Payer: Self-pay

## 2021-11-29 ENCOUNTER — Ambulatory Visit
Admission: EM | Admit: 2021-11-29 | Discharge: 2021-11-29 | Disposition: A | Discharge status: Home / Self Care | Payer: BC Managed Care – PPO | Attending: Internal Medicine | Admitting: Internal Medicine

## 2021-11-29 DIAGNOSIS — K122 Cellulitis and abscess of mouth: Secondary | ICD-10-CM | POA: Insufficient documentation

## 2021-11-29 DIAGNOSIS — J020 Streptococcal pharyngitis: Secondary | ICD-10-CM | POA: Diagnosis not present

## 2021-11-29 DIAGNOSIS — J029 Acute pharyngitis, unspecified: Secondary | ICD-10-CM | POA: Diagnosis present

## 2021-11-29 LAB — POCT RAPID STREP A (OFFICE): Rapid Strep A Screen: POSITIVE — AB

## 2021-11-29 MED ORDER — KETOROLAC TROMETHAMINE 15 MG/ML IJ SOLN
15.0000 mg | Freq: Once | INTRAMUSCULAR | Status: AC
  Administered 2021-11-29: 15 mg via INTRAVENOUS
  Filled 2021-11-29: qty 1

## 2021-11-29 MED ORDER — DEXAMETHASONE SODIUM PHOSPHATE 10 MG/ML IJ SOLN
8.0000 mg | Freq: Once | INTRAMUSCULAR | Status: AC
  Administered 2021-11-29: 8 mg via INTRAVENOUS
  Filled 2021-11-29: qty 1

## 2021-11-29 MED ORDER — CEFDINIR 300 MG PO CAPS: 300.0000 mg | ORAL_CAPSULE | Freq: Two times a day (BID) | ORAL | 0 refills | Status: AC

## 2021-11-29 NOTE — Discharge Instructions (Signed)
Please go to the ER to have peritonsillar abscess ruled out.  You have been prescribed antibiotic to treat strep throat.  Please let ER know that.

## 2021-11-29 NOTE — ED Triage Notes (Signed)
Patient reports sent from [facility] for further eval of peritonsillar abscess. Maintaining saliva and speaking in full sentences without difficulty.

## 2021-11-29 NOTE — ED Notes (Signed)
ED Provider at bedside. 

## 2021-11-29 NOTE — ED Notes (Signed)
Pt sent by UC for peritonsillar abscess. Pt NAD, a/ox4, speaking in full and complete sentences. Able to swallow secretions, airway patent

## 2021-11-29 NOTE — Discharge Instructions (Addendum)
Take the medications as previously written.  Follow-up with your ENT doctor this coming week.  You will need to call Monday to schedule an appointment.  Return immediately back to the ER if:  Your symptoms worsen within the next 12-24 hours. You develop new symptoms such as new fevers, persistent vomiting, new pain, shortness of breath, or new weakness or numbness, or if you have any other concerns.

## 2021-11-29 NOTE — ED Notes (Signed)
Pt NAD, a/ox4. Pt verbalizes understanding of all DC and f/u instructions. All questions answered. Pt walks with steady gait to lobby at DC.  ? ?

## 2021-11-29 NOTE — ED Provider Notes (Signed)
[facility]  HOSPITAL-EMERGENCY DEPT Provider Note   CSN: [id number] Arrival date & time: 11/29/21  1523     History  Chief Complaint  Patient presents with   Sore Throat    Marisa Chen is a 46 y.o. female.  Patient presents chief complaint of sore throat ongoing for 3 days.  Subjective fevers at home.  No vomiting or diarrhea no shortness of breath.  Patient states she has increased pain when she swallows liquids or foods but has been able to continue doing so.  She was seen in urgent care today evaluated and sent to the ER for further evaluation.      Home Medications Prior to Admission medications   Medication Sig Start Date End Date Taking? Authorizing Provider  amphetamine-dextroamphetamine (ADDERALL XR) 20 MG 24 hr capsule Take 1 capsule by mouth every morning. 03/31/21   [provider]  cefdinir (OMNICEF) 300 MG capsule Take 1 capsule (300 mg total) by mouth 2 (two) times daily for 10 days. 11/29/21 12/09/21  Gustavus Bryant, FNP  Cholecalciferol (VITAMIN D3) 1000 units CAPS Take 1 capsule by mouth daily.    [provider]  RA KRILL OIL 500 MG CAPS Take 1 capsule by mouth daily.    [provider]      Allergies    Bee venom, Ciprofloxacin, and Penicillins    Review of Systems   Review of Systems  Constitutional:  Negative for fever.  HENT:  Negative for ear pain.   Eyes:  Negative for pain.  Respiratory:  Negative for cough.   Cardiovascular:  Negative for chest pain.  Gastrointestinal:  Negative for abdominal pain.  Genitourinary:  Negative for flank pain.  Musculoskeletal:  Negative for back pain.  Skin:  Negative for rash.  Neurological:  Negative for headaches.   Physical Exam Updated Vital Signs BP (!) 140/96    Pulse (!) 106    Temp 98.4 F (36.9 C)    Resp 18    SpO2 99%  Physical Exam Constitutional:      General: She is not in acute distress.    Appearance: Normal appearance.  HENT:     Head: Normocephalic.      Nose: Nose normal.     Mouth/Throat:     Comments: Posterior pharynx has mild erythema.  No tonsils present bilaterally.  No exudate noted.  Uvula appears swollen but midline no displacement noted. Eyes:     Extraocular Movements: Extraocular movements intact.  Cardiovascular:     Rate and Rhythm: Normal rate.  Pulmonary:     Effort: Pulmonary effort is normal.  Musculoskeletal:        General: Normal range of motion.     Cervical back: Normal range of motion.  Neurological:     General: No focal deficit present.     Mental Status: She is alert. Mental status is at baseline.    ED Results / Procedures / Treatments   Labs (all labs ordered are listed, but only abnormal results are displayed) Labs Reviewed - No data to display  EKG None  Radiology No results found.  Procedures Procedures    Medications Ordered in ED Medications  ketorolac (TORADOL) 15 MG/ML injection 15 mg (15 mg Intravenous Given 11/29/21 1923)  dexamethasone (DECADRON) injection 8 mg (8 mg Intravenous Given 11/29/21 1923)    ED Course/ Medical Decision Making/ A&P  Medical Decision Making Risk Prescription drug management.   Patient seen at outpatient urgent care today just prior to arrival.  No additional labs or studies were ordered.  Patient given Toradol and Decadron with improvement of symptoms.  Clinically doubt peritonsillar abscess given no uvular deviation no soft tissue swelling no fluctuant lesion.  Patient has been diagnosed with strep throat at urgent care and prescribed antibiotics which she will take.  Advised outpatient follow-up with ENT within the next 2 to 4 days.  Advising immediate return for difficulty breathing worsening symptoms increased welling or any additional concerns.        Final Clinical Impression(s) / ED Diagnoses Final diagnoses:  Uvulitis    Rx / DC Orders ED Discharge Orders     None         Cheryll Cockayne,  MD 11/29/21 [phone]

## 2021-11-29 NOTE — ED Provider Notes (Signed)
[facility]    CSN: [id number] Arrival date & time: 11/29/21  1358      History   Chief Complaint Chief Complaint  Patient presents with   Sore Throat    HPI Marisa Chen is a 46 y.o. female.   Patient presents with sore throat, headache, fever that started approximately 3 days ago.  Patient unsure of Tmax at home.  Has been taking Tylenol with minimal improvement.  Denies any associated upper respiratory symptoms.  Denies any known sick contacts.   Sore Throat   Past Medical History:  Diagnosis Date   Allergy    Cholelithiasis    Decreased libido    FHx: migraine headaches    Hyperlipidemia    Overweight(278.02)     Patient Active Problem List   Diagnosis Date Noted   Pharyngitis 12/04/2018   Acute maxillary sinusitis 12/04/2018   Healthcare maintenance 06/26/2018   BMI 31.0-31.9,adult 06/26/2018   Right ankle pain 05/31/2014   Skin lesion of chest wall 07/14/2013    Past Surgical History:  Procedure Laterality Date   ABDOMINAL HYSTERECTOMY     BLADDER SUSPENSION     CHOLECYSTECTOMY  03/15/2012   TONSILLECTOMY      OB History   No obstetric history on file.      Home Medications    Prior to Admission medications   Medication Sig Start Date End Date Taking? Authorizing Provider  cefdinir (OMNICEF) 300 MG capsule Take 1 capsule (300 mg total) by mouth 2 (two) times daily for 10 days. 11/29/21 12/09/21 Yes Alexah Kivett, Acie Fredrickson, FNP  amphetamine-dextroamphetamine (ADDERALL XR) 20 MG 24 hr capsule Take 1 capsule by mouth every morning. 03/31/21   [provider]  Cholecalciferol (VITAMIN D3) 1000 units CAPS Take 1 capsule by mouth daily.    [provider]  RA KRILL OIL 500 MG CAPS Take 1 capsule by mouth daily.    [provider]    Family History Family History  Problem Relation Age of Onset   Heart disease Mother    Hyperlipidemia Mother    Heart attack Father    Diabetes Father    Hyperlipidemia Father     Hypertension Father    Asthma Sister    Cancer Maternal Grandmother        Breast Cancer   Heart disease Maternal Grandfather    Heart attack Maternal Grandfather    Hyperlipidemia Maternal Grandfather    Hypertension Maternal Grandfather    Diabetes Paternal Grandmother    Heart attack Paternal Grandfather    Hyperlipidemia Paternal Grandfather     Social History Social History   Tobacco Use   Smoking status: Never   Smokeless tobacco: Never  Substance Use Topics   Alcohol use: Yes    Comment: occassionally   Drug use: No     Allergies   Bee venom, Ciprofloxacin, and Penicillins   Review of Systems Review of Systems Per HPI  Physical Exam Triage Vital Signs ED Triage Vitals [11/29/21 1442]  Enc Vitals Group     BP (!) 158/94     Pulse Rate (!) 104     Resp 16     Temp 99.2 F (37.3 C)     Temp Source Oral     SpO2 95 %     Weight      Height      Head Circumference      Peak Flow      Pain Score 6  Pain Loc      Pain Edu?      Excl. in GC?    No data found.  Updated Vital Signs BP (!) 158/94 (BP Location: Right Arm)    Pulse (!) 104    Temp 99.2 F (37.3 C) (Oral)    Resp 16    SpO2 95%   Visual Acuity Right Eye Distance:   Left Eye Distance:   Bilateral Distance:    Right Eye Near:   Left Eye Near:    Bilateral Near:     Physical Exam Constitutional:      General: She is not in acute distress.    Appearance: Normal appearance. She is not toxic-appearing or diaphoretic.  HENT:     Head: Normocephalic and atraumatic.     Right Ear: Tympanic membrane and ear canal normal.     Left Ear: Tympanic membrane and ear canal normal.     Nose: Nose normal.     Mouth/Throat:     Mouth: Mucous membranes are moist.     Pharynx: Oropharyngeal exudate and posterior oropharyngeal erythema present.     Tonsils: Tonsillar exudate present. 1+ on the right. 1+ on the left.     Comments: Muffled voice. Eyes:     Extraocular Movements: Extraocular  movements intact.     Conjunctiva/sclera: Conjunctivae normal.     Pupils: Pupils are equal, round, and reactive to light.  Cardiovascular:     Rate and Rhythm: Normal rate and regular rhythm.     Pulses: Normal pulses.     Heart sounds: Normal heart sounds.  Pulmonary:     Effort: Pulmonary effort is normal. No respiratory distress.     Breath sounds: Normal breath sounds. No stridor. No wheezing, rhonchi or rales.  Abdominal:     General: Abdomen is flat. Bowel sounds are normal.     Palpations: Abdomen is soft.  Musculoskeletal:        General: Normal range of motion.     Cervical back: Normal range of motion.  Skin:    General: Skin is warm and dry.  Neurological:     General: No focal deficit present.     Mental Status: She is alert and oriented to person, place, and time. Mental status is at baseline.  Psychiatric:        Mood and Affect: Mood normal.        Behavior: Behavior normal.     UC Treatments / Results  Labs (all labs ordered are listed, but only abnormal results are displayed) Labs Reviewed  POCT RAPID STREP A (OFFICE) - Abnormal; Notable for the following components:      Result Value   Rapid Strep A Screen Positive (*)    All other components within normal limits    EKG   Radiology No results found.  Procedures Procedures (including critical care time)  Medications Ordered in UC Medications - No data to display  Initial Impression / Assessment and Plan / UC Course  I have reviewed the triage vital signs and the nursing notes.  Pertinent labs & imaging results that were available during my care of the patient were reviewed by me and considered in my medical decision making (see chart for details).     Rapid strep was positive.  Will treat with cefdinir antibiotic given penicillin allergy.  She reports that penicillin allergy is vomiting.  Patient has muffled voice, tachycardia, fever so I am concerned for possible peritonsillar abscess.   Patient sent  to ER to have peritonsillar abscess ruled out.  Do not have the ability to order CT scan in urgent care.  Advised patient to notify ER that cefdinir antibiotic was already prescribed.  Patient verbalized understanding was agreeable with plan.  Vital signs stable at discharge.  Agree with patient self transport to the hospital. Final Clinical Impressions(s) / UC Diagnoses   Final diagnoses:  Strep pharyngitis     Discharge Instructions      Please go to the ER to have peritonsillar abscess ruled out.  You have been prescribed antibiotic to treat strep throat.  Please let ER know that.    ED Prescriptions     Medication Sig Dispense Auth. Provider   cefdinir (OMNICEF) 300 MG capsule Take 1 capsule (300 mg total) by mouth 2 (two) times daily for 10 days. 20 capsule Gustavus Bryant, [place]      PDMP not reviewed this encounter.   Gustavus Bryant, [place] 11/29/21 [phone]

## 2021-11-29 NOTE — ED Provider Triage Note (Signed)
Emergency Medicine Provider Triage Evaluation Note  Marisa Chen , a 46 y.o. female  was evaluated in triage.  Pt complains of sore throat for 3 days.  She was diagnosed with strep throat at urgent care today.  She was given a prescription for cefdinir which she has not yet started.  There was concern of peritonsillar abscess, so patient was sent to the emergency department.  Patient has been eating and drinking small amounts of fluid and foods, but states difficulty with swallowing due to pain.  Review of Systems  Positive: Sore throat Negative: Vomiting  Physical Exam  BP (!) 156/109    Pulse (!) 113    Temp 98.4 F (36.9 C)    Resp 18    SpO2 99%  Gen:   Awake, no distress   Resp:  Normal effort  MSK:   Moves extremities without difficulty  Other:  ENT significant erythema without uvula deviation or posterior pharyngeal fullness suggestive of PTA, mild uvulitis without significant swelling or airway compromise   Medical Decision Making  Medically screening exam initiated at 4:06 PM.  Appropriate orders placed.  LIN HACKMANN was informed that the remainder of the evaluation will be completed by another provider, this initial triage assessment does not replace that evaluation, and the importance of remaining in the ED until their evaluation is complete.     Renne Crigler, PA-C 11/29/21 1608

## 2021-11-29 NOTE — ED Triage Notes (Signed)
Patient reports sore throat and headache since Friday, also c/o fever. Due for more tylenol now. Has muffled voice sounds. Reports her uvula being swollen

## 2022-01-04 ENCOUNTER — Encounter: Payer: Self-pay | Admitting: Physician Assistant

## 2022-10-14 ENCOUNTER — Ambulatory Visit: Payer: BC Managed Care – PPO | Admitting: Nurse Practitioner

## 2022-10-14 ENCOUNTER — Encounter: Payer: Self-pay | Admitting: Nurse Practitioner

## 2022-10-14 VITALS — BP 139/90 | HR 80 | Resp 18 | Ht 64.0 in | Wt 201.0 lb

## 2022-10-14 DIAGNOSIS — R5383 Other fatigue: Secondary | ICD-10-CM | POA: Diagnosis not present

## 2022-10-14 DIAGNOSIS — Z0000 Encounter for general adult medical examination without abnormal findings: Secondary | ICD-10-CM

## 2022-10-14 DIAGNOSIS — N951 Menopausal and female climacteric states: Secondary | ICD-10-CM

## 2022-10-14 DIAGNOSIS — E559 Vitamin D deficiency, unspecified: Secondary | ICD-10-CM

## 2022-10-14 DIAGNOSIS — R635 Abnormal weight gain: Secondary | ICD-10-CM

## 2022-10-14 DIAGNOSIS — L509 Urticaria, unspecified: Secondary | ICD-10-CM

## 2022-10-14 DIAGNOSIS — T7840XA Allergy, unspecified, initial encounter: Secondary | ICD-10-CM

## 2022-10-14 MED ORDER — EPINEPHRINE 0.3 MG/0.3ML IJ SOAJ: 0.3000 mg | INTRAMUSCULAR | 1 refills | Status: DC | PRN

## 2022-10-14 MED ORDER — TRIAMCINOLONE ACETONIDE 0.025 % EX CREA: 1.0000 | TOPICAL_CREAM | Freq: Two times a day (BID) | CUTANEOUS | 2 refills | Status: DC

## 2022-10-14 MED ORDER — METHYLPREDNISOLONE 4 MG PO TBPK: ORAL_TABLET | ORAL | 0 refills | Status: DC

## 2022-10-14 NOTE — Progress Notes (Signed)
Established patient visit   Patient: Marisa Chen   DOB: [birth year]   46 y.o. Female  MRN: [id number] Visit Date: 10/14/2022  Chief Complaint  Patient presents with   Rash   Subjective    Rash This is a recurrent problem. The current episode started 1 to 4 weeks ago. The problem has been waxing and waning since onset. The affected locations include the face.    The patient states that she first noted the hives was December 1. Initially thought they were related to a new face cream. She immediately stopped using this. States that intermittently, hives kept occurring. Every time they happen, they happen suddenly and each time they are getting more severe. Yesterday, she woke up with them all over her face and had moderate facial swelling. She did take benadryl which helped some. Still has some fine hives on the face, upper arms, and bilateral upper thighs.   -she denies swelling of the tongue or lips -denies wheezing or shortness of breath.  Of note, she she states that she was bit by a tick earlier this year. She is not sure if this is related to new onset of hives.   Medications: Outpatient Medications Prior to Visit  Medication Sig   amphetamine-dextroamphetamine (ADDERALL XR) 20 MG 24 hr capsule Take 1 capsule by mouth every morning.   Cholecalciferol (VITAMIN D3) 1000 units CAPS Take 1 capsule by mouth daily.   RA KRILL OIL 500 MG CAPS Take 1 capsule by mouth daily.   No facility-administered medications prior to visit.    Review of Systems  Skin:  Positive for rash.     Objective     Today's Vitals   10/14/22 1337 10/14/22 1338  BP: (Abnormal) 147/90 (Abnormal) 139/90  Pulse: 80   Resp: 18   SpO2: 98%   Weight: 201 lb (91.2 kg)   Height: 5\' 4"  (1.626 m)    Body mass index is 34.5 kg/m.   Physical Exam Vitals and nursing note reviewed.  Constitutional:      Appearance: Normal appearance. She is well-developed.  HENT:     Head: Normocephalic and atraumatic.      Right Ear: Tympanic membrane, ear canal and external ear normal.     Left Ear: Tympanic membrane, ear canal and external ear normal.     Nose: Nose normal.     Mouth/Throat:     Mouth: Mucous membranes are moist.     Pharynx: Oropharynx is clear.  Eyes:     Extraocular Movements: Extraocular movements intact.     Conjunctiva/sclera: Conjunctivae normal.     Pupils: Pupils are equal, round, and reactive to light.  Cardiovascular:     Rate and Rhythm: Normal rate and regular rhythm.     Pulses: Normal pulses.     Heart sounds: Normal heart sounds.  Pulmonary:     Effort: Pulmonary effort is normal.     Breath sounds: Normal breath sounds.  Abdominal:     Palpations: Abdomen is soft.  Musculoskeletal:        General: Normal range of motion.     Cervical back: Normal range of motion and neck supple.  Lymphadenopathy:     Cervical: No cervical adenopathy.  Skin:    General: Skin is warm and dry.     Capillary Refill: Capillary refill takes less than 2 seconds.     Findings: Rash present.     Comments: Presence of hives - generalized coverage. Red, itchy, and warm to  touch. Skin intact and no drainage noted.   Neurological:     General: No focal deficit present.     Mental Status: She is alert and oriented to person, place, and time.  Psychiatric:        Mood and Affect: Mood normal.        Behavior: Behavior normal.        Thought Content: Thought content normal.        Judgment: Judgment normal.      Assessment & Plan     1. Allergic reaction, initial encounter Start medrol taper. Take as directed for 6 days. New rx given  for epi-pen. Use as directed. Advised that she consult emergency care if needing to use epi-pen. She voiced understanding.  Check labs for allergy to basic foods and for alpha-gal syndrome.  - methylPREDNISolone (MEDROL) 4 MG TBPK tablet; Take by mouth as directed for 6 days  Dispense: 21 tablet; Refill: 0 - EPINEPHrine (EPIPEN 2-PAK) 0.3 mg/0.3 mL IJ  SOAJ injection; Inject 0.3 mg into the muscle as needed for anaphylaxis.  Dispense: 1 each; Refill: 1 - Alpha-Gal Panel; Future - Allergen Profile, Basic; Future  2. Hives Use triamcinolone cream on all effected areas twice daily as needed. Check labs for basic food allergies and for alpha gal syndrome.  - triamcinolone (KENALOG) 0.025 % cream; Apply 1 Application topically 2 (two) times daily.  Dispense: 80 g; Refill: 2 - Alpha-Gal Panel; Future - Allergen Profile, Basic; Future  3. Perimenopause Check reproductive hormones  - FSH/LH; Future - Estradiol; Future  4. Other fatigue Check labs for further evaluation.  - CBC; Future  5. Abnormal weight gain Check thyroid panel and HgbA1c for further evaluation.  - TSH + free T4; Future - Hemoglobin A1c; Future  6. Vitamin D deficiency Check vitamin d level and treat deficiency as indicated.   - VITAMIN D 25 Hydroxy (Vit-D Deficiency, Fractures); Future  7. Healthcare maintenance Routine, fasting labs ordered during today's visit.  - Hemoglobin A1c; Future - Lipid panel; Future - Comprehensive metabolic panel; Future - CBC; Future   Return in about 4 months (around 02/13/2023) for health maintenance exam - blood work tomorrow. i am putting the orders in now. see below.        Carlean Jews, NP  [facility] at [facility] [phone] (phone) [phone] (fax)  [facility]

## 2022-10-15 ENCOUNTER — Other Ambulatory Visit: Payer: BC Managed Care – PPO

## 2022-10-15 DIAGNOSIS — L509 Urticaria, unspecified: Secondary | ICD-10-CM

## 2022-10-15 DIAGNOSIS — R5383 Other fatigue: Secondary | ICD-10-CM

## 2022-10-15 DIAGNOSIS — N951 Menopausal and female climacteric states: Secondary | ICD-10-CM

## 2022-10-15 DIAGNOSIS — R635 Abnormal weight gain: Secondary | ICD-10-CM

## 2022-10-15 DIAGNOSIS — Z0000 Encounter for general adult medical examination without abnormal findings: Secondary | ICD-10-CM

## 2022-10-15 DIAGNOSIS — T7840XA Allergy, unspecified, initial encounter: Secondary | ICD-10-CM

## 2022-10-15 DIAGNOSIS — E559 Vitamin D deficiency, unspecified: Secondary | ICD-10-CM

## 2022-10-17 LAB — COMPREHENSIVE METABOLIC PANEL WITH GFR
ALT: 50 [IU]/L — ABNORMAL HIGH (ref 0–32)
AST: 34 [IU]/L (ref 0–40)
Albumin/Globulin Ratio: 1.7 (ref 1.2–2.2)
Albumin: 4.5 g/dL (ref 3.9–4.9)
Alkaline Phosphatase: 41 [IU]/L — ABNORMAL LOW (ref 44–121)
BUN/Creatinine Ratio: 18 (ref 9–23)
BUN: 14 mg/dL (ref 6–24)
Bilirubin Total: 0.5 mg/dL (ref 0.0–1.2)
CO2: 18 mmol/L — ABNORMAL LOW (ref 20–29)
Calcium: 9.7 mg/dL (ref 8.7–10.2)
Chloride: 102 mmol/L (ref 96–106)
Creatinine, Ser: 0.78 mg/dL (ref 0.57–1.00)
Globulin, Total: 2.7 g/dL (ref 1.5–4.5)
Glucose: 115 mg/dL — ABNORMAL HIGH (ref 70–99)
Potassium: 4.4 mmol/L (ref 3.5–5.2)
Sodium: 137 mmol/L (ref 134–144)
Total Protein: 7.2 g/dL (ref 6.0–8.5)
eGFR: 95 mL/min/{1.73_m2}

## 2022-10-17 LAB — FSH/LH
FSH: 1.6 m[IU]/mL
LH: 2.3 m[IU]/mL

## 2022-10-17 LAB — ALLERGEN PROFILE, BASIC
Codfish IgE: <0.1 kU/L
Egg White IgE: 0.12 kU/L — AB
Milk IgE: <0.1 kU/L
Peanut IgE: 0.3 kU/L — AB
Soybean IgE: 0.13 kU/L — AB
Wheat IgE: 0.32 kU/L — AB

## 2022-10-17 LAB — ALPHA-GAL PANEL
Allergen Lamb IgE: 0.18 kU/L — AB
Beef IgE: 0.37 kU/L — AB
IgE (Immunoglobulin E), Serum: 280 IU/mL (ref 6–495)
O215-IgE Alpha-Gal: 1.59 kU/L — AB
Pork IgE: 0.12 kU/L — AB

## 2022-10-17 LAB — CBC
Hematocrit: 41.9 % (ref 34.0–46.6)
Hemoglobin: 14.5 g/dL (ref 11.1–15.9)
MCH: 29.9 pg (ref 26.6–33.0)
MCHC: 34.6 g/dL (ref 31.5–35.7)
MCV: 86 fL (ref 79–97)
Platelets: 320 10*3/uL (ref 150–450)
RBC: 4.85 x10E6/uL (ref 3.77–5.28)
RDW: 12.8 % (ref 11.7–15.4)
WBC: 9.4 10*3/uL (ref 3.4–10.8)

## 2022-10-17 LAB — VITAMIN D 25 HYDROXY (VIT D DEFICIENCY, FRACTURES): Vit D, 25-Hydroxy: 21 ng/mL — ABNORMAL LOW (ref 30.0–100.0)

## 2022-10-17 LAB — TSH+FREE T4
Free T4: 0.95 ng/dL (ref 0.82–1.77)
TSH: 1.34 u[IU]/mL (ref 0.450–4.500)

## 2022-10-17 LAB — HEMOGLOBIN A1C
Est. average glucose Bld gHb Est-mCnc: 123 mg/dL
Hgb A1c MFr Bld: 5.9 % — ABNORMAL HIGH (ref 4.8–5.6)

## 2022-10-17 LAB — LIPID PANEL
Chol/HDL Ratio: 7.6 ratio — ABNORMAL HIGH (ref 0.0–4.4)
Cholesterol, Total: 229 mg/dL — ABNORMAL HIGH (ref 100–199)
HDL: 30 mg/dL — ABNORMAL LOW
LDL Chol Calc (NIH): 137 mg/dL — ABNORMAL HIGH (ref 0–99)
Triglycerides: 340 mg/dL — ABNORMAL HIGH (ref 0–149)
VLDL Cholesterol Cal: 62 mg/dL — ABNORMAL HIGH (ref 5–40)

## 2022-10-17 LAB — ESTRADIOL: Estradiol: 146 pg/mL

## 2022-11-03 ENCOUNTER — Telehealth: Payer: Self-pay

## 2022-11-03 NOTE — Telephone Encounter (Signed)
Pt called requesting an referral for an allergist.  LOV 10/14/22 ROV 02/28/23

## 2022-11-08 NOTE — Telephone Encounter (Signed)
Let me know when the referral has been placed and I will figure out where to send this.Venezia Sargeant Zimmerman Rumple, CMA

## 2022-11-14 ENCOUNTER — Other Ambulatory Visit: Payer: Self-pay | Admitting: Nurse Practitioner

## 2022-11-14 DIAGNOSIS — L272 Dermatitis due to ingested food: Secondary | ICD-10-CM

## 2022-11-14 DIAGNOSIS — L509 Urticaria, unspecified: Secondary | ICD-10-CM | POA: Insufficient documentation

## 2022-11-14 DIAGNOSIS — R635 Abnormal weight gain: Secondary | ICD-10-CM | POA: Insufficient documentation

## 2022-11-14 DIAGNOSIS — T7840XA Allergy, unspecified, initial encounter: Secondary | ICD-10-CM | POA: Insufficient documentation

## 2022-11-14 DIAGNOSIS — E559 Vitamin D deficiency, unspecified: Secondary | ICD-10-CM | POA: Insufficient documentation

## 2022-11-14 DIAGNOSIS — N951 Menopausal and female climacteric states: Secondary | ICD-10-CM | POA: Insufficient documentation

## 2022-11-14 DIAGNOSIS — Z9109 Other allergy status, other than to drugs and biological substances: Secondary | ICD-10-CM

## 2022-11-14 DIAGNOSIS — R5383 Other fatigue: Secondary | ICD-10-CM | POA: Insufficient documentation

## 2022-11-14 NOTE — Telephone Encounter (Signed)
I have placed referral to allergist for her.

## 2022-11-24 ENCOUNTER — Encounter: Payer: Self-pay | Admitting: Gastroenterology

## 2023-01-11 LAB — HM MAMMOGRAPHY

## 2023-01-13 ENCOUNTER — Encounter: Payer: Self-pay | Admitting: Gastroenterology

## 2023-01-13 ENCOUNTER — Ambulatory Visit: Payer: BC Managed Care – PPO | Admitting: Gastroenterology

## 2023-01-13 ENCOUNTER — Other Ambulatory Visit: Payer: BC Managed Care – PPO

## 2023-01-13 VITALS — BP 142/82 | HR 67 | Ht 64.0 in | Wt 197.0 lb

## 2023-01-13 DIAGNOSIS — K580 Irritable bowel syndrome with diarrhea: Secondary | ICD-10-CM | POA: Diagnosis not present

## 2023-01-13 DIAGNOSIS — R7989 Other specified abnormal findings of blood chemistry: Secondary | ICD-10-CM | POA: Diagnosis not present

## 2023-01-13 DIAGNOSIS — K760 Fatty (change of) liver, not elsewhere classified: Secondary | ICD-10-CM

## 2023-01-13 DIAGNOSIS — Z1211 Encounter for screening for malignant neoplasm of colon: Secondary | ICD-10-CM | POA: Diagnosis not present

## 2023-01-13 DIAGNOSIS — Z1212 Encounter for screening for malignant neoplasm of rectum: Secondary | ICD-10-CM

## 2023-01-13 MED ORDER — CLENPIQ 10-3.5-12 MG-GM -GM/175ML PO SOLN: 1.0000 | ORAL | 0 refills | Status: DC

## 2023-01-13 NOTE — Patient Instructions (Signed)
_______________________________________________________  If your blood pressure at your visit was 140/90 or greater, please contact your primary care physician to follow up on this.  _______________________________________________________  If you are age 46 or older, your body mass index should be between 23-30. Your Body mass index is 33.81 kg/m. If this is out of the aforementioned range listed, please consider follow up with your Primary Care Provider.  If you are age 21 or younger, your body mass index should be between 19-25. Your Body mass index is 33.81 kg/m. If this is out of the aformentioned range listed, please consider follow up with your Primary Care Provider.   ________________________________________________________  The [facility] providers would like to encourage you to use [facility] to communicate with providers for non-urgent requests or questions.  Due to long hold times on the telephone, sending your provider a message by [facility] may be a faster and more efficient way to get a response.  Please allow 48 business hours for a response.  Please remember that this is for non-urgent requests.  _______________________________________________________  Your provider has requested that you go to the basement level for lab work before leaving today. Press "B" on the elevator. The lab is located at the first door on the left as you exit the elevator.  You have been scheduled for a colonoscopy. Please follow written instructions given to you at your visit today.  Please pick up your prep supplies at the pharmacy within the next 1-3 days. If you use inhalers (even only as needed), please bring them with you on the day of your procedure.  We have sent the following medications to your pharmacy for you to pick up at your convenience: Clenpiq  You have been scheduled for an abdominal ultrasound at [facility] through the ED for an outpatient imaging on 01-22-2023 at 830am.  Please arrive 30 minutes prior to your appointment for registration. Make certain not to have anything to eat or drink midnight prior to your appointment. Should you need to reschedule your appointment, please contact radiology at [phone]. This test typically takes about 30 minutes to perform.  Thank you,  Dr. Jackquline Denmark

## 2023-01-13 NOTE — Progress Notes (Addendum)
Chief Complaint: For colonoscopy  Referring Provider:  Ronnell Freshwater, NP      ASSESSMENT AND PLAN;   #1. CRC screening  #2. IBS-C  #3. Abn ALT likely d/t fatty liver  Plan: -Colon with 2 day prep. -USE -Celiac serology -Increase water intake.  Can use fiber supplements if she has pellet-like stools. -Wt loss (6lb over 12 weeks)-avoid fatty foods and start exercising. -FU in 12 weeks.  At FU, recheck LFTs. If still elevated, viral, serologic and metabolic WU.   HPI:    Marisa Chen is a 47 y.o. female  With recently Dx alpha gal, gluten intolerant (being followed by allergy), obesity, HLD, migraines, s/p cholecystectomy and hysterectomy  With longstanding history of GI problems-including alternating diarrhea and constipation.  Underwent EGD/colonoscopy over 10 years ago which was neg.  Reports are awaited (?[facility] or Dr Collene Mares)  Here for screening colonoscopy  Describes alternating constipation and diarrhea-more constipation with pellet-like stools, abdominal bloating.  Worse constipation has been 1 BM in 1 week.  She had to take laxatives. Generalized abdominal bloating with lower abdominal pain which gets better with BMs.  Occasional heartburn but not bad.  She has been diagnosed as having gluten sensitivity and has been on decreased gluten diet-not completely gluten-free.  She also been diagnosed with alpha gal-staying away from red meats.  Has EpiPen but did not have to use it.  Denies having any melena or hematochezia.  No fever chills or night sweats.   He had abnormal ALT dating back to 2017.  Has been told to lose weight.   Occ itching and had skin lesions.  No easy bruisability, intake of OTC meds including diet pills, herbal medications, anabolic steroids or Tylenol. There is no H/O blood transfusions, IVDA or FH of liver disease. No jaundice, dark urine or pale stools. No alcohol abuse.     Latest Ref Rng & Units 10/15/2022    8:49 AM 02/21/2018    12:00 AM 08/14/2016   12:00 AM  Hepatic Function  Total Protein 6.0 - 8.5 g/dL 7.2     Albumin 3.9 - 4.9 g/dL 4.5     AST 0 - 40 IU/L 34  24     29      ALT 0 - 32 IU/L 50  41     51      Alk Phosphatase 44 - 121 IU/L 41  38     46      Total Bilirubin 0.0 - 1.2 mg/dL 0.5        This result is from an external source.    Wt Readings from Last 3 Encounters:  01/13/23 197 lb (89.4 kg)  10/14/22 201 lb (91.2 kg)  10/09/21 195 lb (88.5 kg)     Past Medical History:  Diagnosis Date   Allergy    Cholelithiasis    Decreased libido    FHx: migraine headaches    Hyperlipidemia    Overweight(278.02)     Past Surgical History:  Procedure Laterality Date   ABDOMINAL HYSTERECTOMY     BLADDER SUSPENSION     CHOLECYSTECTOMY  03/15/2012   TONSILLECTOMY      Family History  Problem Relation Age of Onset   Heart disease Mother    Hyperlipidemia Mother    Crohn's disease Mother    Cancer Mother        vulva   Heart attack Father    Diabetes Father    Hyperlipidemia Father  Hypertension Father    Asthma Sister    Cancer Maternal Grandmother        Breast Cancer   Heart disease Maternal Grandfather    Heart attack Maternal Grandfather    Hyperlipidemia Maternal Grandfather    Hypertension Maternal Grandfather    Diabetes Paternal Grandmother    Heart attack Paternal Grandfather    Hyperlipidemia Paternal Grandfather     Social History   Tobacco Use   Smoking status: Never   Smokeless tobacco: Never  Substance Use Topics   Alcohol use: Yes    Comment: occassionally   Drug use: No    Current Outpatient Medications  Medication Sig Dispense Refill   Cholecalciferol (VITAMIN D3) 1000 units CAPS Take 1 capsule by mouth daily.     EPINEPHrine (EPIPEN 2-PAK) 0.3 mg/0.3 mL IJ SOAJ injection Inject 0.3 mg into the muscle as needed for anaphylaxis. 1 each 1   meloxicam (MOBIC) 15 MG tablet Take 15 mg by mouth daily.     RA KRILL OIL 500 MG CAPS Take 1 capsule by mouth  daily.     amphetamine-dextroamphetamine (ADDERALL XR) 20 MG 24 hr capsule Take 1 capsule by mouth every morning. (Patient not taking: Reported on 01/13/2023)     methylPREDNISolone (MEDROL) 4 MG TBPK tablet Take by mouth as directed for 6 days (Patient not taking: Reported on 01/13/2023) 21 tablet 0   triamcinolone (KENALOG) 0.025 % cream Apply 1 Application topically 2 (two) times daily. (Patient not taking: Reported on 01/13/2023) 80 g 2   No current facility-administered medications for this visit.    Allergies  Allergen Reactions   Bee Venom Hives    Burning and Edema   Ciprofloxacin    Penicillins     Review of Systems:  Constitutional: Denies fever, chills, diaphoresis, appetite change and has fatigue.  HEENT: Has multiple allergies.  Occasional vision changes. Respiratory: Denies SOB, DOE, cough, chest tightness,  and wheezing.   Cardiovascular: Denies chest pain, palpitations and leg swelling.  Genitourinary: Denies dysuria, urgency, frequency, hematuria, flank pain and difficulty urinating.  Musculoskeletal: Denies myalgias, has lower back pain, joint swelling, arthralgias and gait problem.  Skin: No rash.  Neurological: Denies dizziness, seizures, syncope, weakness, light-headedness, numbness and has headaches.  Hematological: Denies adenopathy. Easy bruising, personal or family bleeding history  Psychiatric/Behavioral: No anxiety or depression     Physical Exam:    BP (!) 142/82   Pulse 67   Ht 5\' 4"  (1.626 m)   Wt 197 lb (89.4 kg)   BMI 33.81 kg/m  Wt Readings from Last 3 Encounters:  01/13/23 197 lb (89.4 kg)  10/14/22 201 lb (91.2 kg)  10/09/21 195 lb (88.5 kg)   Constitutional:  Well-developed, in no acute distress. Psychiatric: Normal mood and affect. Behavior is normal. HEENT: Pupils normal.  Conjunctivae are normal. No scleral icterus. Cardiovascular: Normal rate, regular rhythm. No edema Pulmonary/chest: Effort normal and breath sounds normal. No  wheezing, rales or rhonchi. Abdominal: Soft, nondistended. Nontender. Bowel sounds active throughout. There are no masses palpable.  Liver is palpated 2 cm below the costal margin. Rectal: Deferred Neurological: Alert and oriented to person place and time. Skin: Skin is warm and dry. No rashes noted.  Data Reviewed: I have personally reviewed following labs and imaging studies  CBC:    Latest Ref Rng & Units 10/15/2022    8:49 AM 02/21/2018   12:00 AM 08/14/2016   12:00 AM  CBC  WBC 3.4 - 10.8 x10E3/uL 9.4  7.6     8.8      Hemoglobin 11.1 - 15.9 g/dL 14.5  14.5     14.2      Hematocrit 34.0 - 46.6 % 41.9  42     43      Platelets 150 - 450 x10E3/uL 320  303     313         This result is from an external source.    CMP:    Latest Ref Rng & Units 10/15/2022    8:49 AM 02/21/2018   12:00 AM 08/14/2016   12:00 AM  CMP  Glucose 70 - 99 mg/dL 115     BUN 6 - 24 mg/dL 14  13     16       Creatinine 0.57 - 1.00 mg/dL 0.78  0.7     0.7      Sodium 134 - 144 mmol/L 137  137     139      Potassium 3.5 - 5.2 mmol/L 4.4  4.3     4.5      Chloride 96 - 106 mmol/L 102     CO2 20 - 29 mmol/L 18     Calcium 8.7 - 10.2 mg/dL 9.7     Total Protein 6.0 - 8.5 g/dL 7.2     Total Bilirubin 0.0 - 1.2 mg/dL 0.5     Alkaline Phos 44 - 121 IU/L 41  38     46      AST 0 - 40 IU/L 34  24     29      ALT 0 - 32 IU/L 50  41     51         This result is from an external source.        Carmell Austria, MD 01/13/2023, 10:20 AM  Cc: Ronnell Freshwater, NP

## 2023-01-15 LAB — CELIAC PANEL 10
Antigliadin Abs, IgA: 6 units (ref 0–19)
Endomysial IgA: NEGATIVE
Gliadin IgG: 2 units (ref 0–19)
IgA/Immunoglobulin A, Serum: 192 mg/dL (ref 87–352)
Tissue Transglut Ab: 3 U/mL (ref 0–5)
Transglutaminase IgA: <2 U/mL (ref 0–3)

## 2023-01-17 ENCOUNTER — Encounter: Payer: Self-pay | Admitting: Nurse Practitioner

## 2023-01-22 ENCOUNTER — Ambulatory Visit (HOSPITAL_COMMUNITY): Payer: BC Managed Care – PPO

## 2023-02-26 ENCOUNTER — Ambulatory Visit (HOSPITAL_COMMUNITY)
Admission: RE | Admit: 2023-02-26 | Discharge: 2023-02-26 | Disposition: A | Discharge status: Home / Self Care | Payer: BC Managed Care – PPO | Source: Ambulatory Visit | Attending: Gastroenterology | Admitting: Gastroenterology

## 2023-02-26 DIAGNOSIS — K580 Irritable bowel syndrome with diarrhea: Secondary | ICD-10-CM | POA: Insufficient documentation

## 2023-02-26 DIAGNOSIS — Z1211 Encounter for screening for malignant neoplasm of colon: Secondary | ICD-10-CM | POA: Diagnosis present

## 2023-02-26 DIAGNOSIS — K760 Fatty (change of) liver, not elsewhere classified: Secondary | ICD-10-CM | POA: Diagnosis present

## 2023-02-26 DIAGNOSIS — Z1212 Encounter for screening for malignant neoplasm of rectum: Secondary | ICD-10-CM | POA: Diagnosis present

## 2023-02-26 DIAGNOSIS — R7989 Other specified abnormal findings of blood chemistry: Secondary | ICD-10-CM | POA: Insufficient documentation

## 2023-02-28 ENCOUNTER — Encounter: Payer: BC Managed Care – PPO | Admitting: Nurse Practitioner

## 2023-03-07 ENCOUNTER — Encounter: Payer: BC Managed Care – PPO | Admitting: Nurse Practitioner

## 2023-03-07 ENCOUNTER — Encounter: Payer: Self-pay | Admitting: Nurse Practitioner

## 2023-03-07 ENCOUNTER — Ambulatory Visit (INDEPENDENT_AMBULATORY_CARE_PROVIDER_SITE_OTHER): Payer: BC Managed Care – PPO | Admitting: Nurse Practitioner

## 2023-03-07 VITALS — BP 118/77 | HR 72 | Ht 64.0 in | Wt 192.8 lb

## 2023-03-07 DIAGNOSIS — Z91018 Allergy to other foods: Secondary | ICD-10-CM | POA: Diagnosis not present

## 2023-03-07 DIAGNOSIS — E782 Mixed hyperlipidemia: Secondary | ICD-10-CM

## 2023-03-07 DIAGNOSIS — Z0001 Encounter for general adult medical examination with abnormal findings: Secondary | ICD-10-CM

## 2023-03-07 DIAGNOSIS — Z6833 Body mass index (BMI) 33.0-33.9, adult: Secondary | ICD-10-CM

## 2023-03-07 DIAGNOSIS — R7301 Impaired fasting glucose: Secondary | ICD-10-CM

## 2023-03-07 NOTE — Assessment & Plan Note (Signed)
Hemoglobin A1c 5.9 on recent labs. -Recommend reduction of carbohydrate and sugar intake. -Increase water intake. -Recheck hemoglobin A1c in 7 months for further evaluation.

## 2023-03-07 NOTE — Progress Notes (Signed)
Complete physical exam   Patient: Marisa Chen   DOB: 03-08-1976   47 y.o. Female  MRN: [id number] Visit Date: 03/07/2023    Chief Complaint  Patient presents with   Annual Exam   Subjective    Marisa Chen is a 47 y.o. female who presents today for a complete physical exam.  She reports consuming a  low fat  diet. Exercise is limited by orthopedic condition(s): hip pain. She generally feels fairly well. She does not have additional problems to discuss today.   HPI  Annual physical  -Routine labs done earlier this year. -Moderate elevation of cholesterol. --Strong family history of cardiovascular disease. -Hemoglobin A1c 5.9 with glucose 115. -Basic food allergy panel and alpha gal panel also drawn. --Patient positive for alpha gal allergy. --Mildly positive for allergy to multiple foods including eggs, wheat, peanuts, soybean, beef, pork, and lamb. -Mammogram done 01/11/2023 was negative. -Patient scheduled for colonoscopy March 29, 2023.  Since labs have come back, patient has eliminated beef, pork, lamb, peanuts, and wheat from her diet.  She has had no further episodes of hives. -She has had a 9 pound weight loss since December 2023.  -she denies chest pain, chest pressure, or shortness of breath. She denies headaches or visual disturbances. She denies abdominal pain, nausea, vomiting, or changes in bowel or bladder habits.    Past Medical History:  Diagnosis Date   Allergy    Cholelithiasis    Decreased libido    FHx: migraine headaches    Hyperlipidemia    Overweight(278.02)    Past Surgical History:  Procedure Laterality Date   ABDOMINAL HYSTERECTOMY     BLADDER SUSPENSION     CHOLECYSTECTOMY  03/15/2012   TONSILLECTOMY     Social History   Socioeconomic History   Marital status: Married    Spouse name: JOHNATHAN   Number of children: 3   Years of education: 12   Highest education level: Not on file  Occupational History    Employer: [employer]  Tobacco Use   Smoking status: Never   Smokeless tobacco: Never  Substance and Sexual Activity   Alcohol use: Yes    Comment: occassionally   Drug use: No   Sexual activity: Yes    Birth control/protection: Surgical  Other Topics Concern   Not on file  Social History Narrative   Marital Status: Married IT sales professional)    Children:  Daughters (2) Son (1)    Pets: Cows/ Chickens/ Dogs    Living Situation: Lives with husband and children.   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     Education: Engineer, agricultural   Tobacco Use/Exposure:  None    Alcohol Use:  Occasional   Drug Use:  None   Diet:  Regular   Exercise: Limited    Hobbies: Art/Running               Social Determinants of Health   Financial Resource Strain: Not on file  Food Insecurity: Not on file  Transportation Needs: Not on file  Physical Activity: Not on file  Stress: Not on file  Social Connections: Not on file  Intimate Partner Violence: Not on file   Family Status  Relation Name Status   Mother  Alive   Father  Alive   Sister  Alive   MGM  Deceased   MGF  Deceased   PGM  (Not Specified)  PGF  (Not Specified)   Family History  Problem Relation Age of Onset   Heart disease Mother    Hyperlipidemia Mother    Crohn's disease Mother    Cancer Mother        vulva   Heart attack Father    Diabetes Father    Hyperlipidemia Father    Hypertension Father    Asthma Sister    Cancer Maternal Grandmother        Breast Cancer   Heart disease Maternal Grandfather    Heart attack Maternal Grandfather    Hyperlipidemia Maternal Grandfather    Hypertension Maternal Grandfather    Diabetes Paternal Grandmother    Heart attack Paternal Grandfather    Hyperlipidemia Paternal Grandfather    Allergies  Allergen Reactions   Bee Venom Hives    Burning and Edema   Ciprofloxacin    Penicillins     Patient Care Team: Carlean Jews, NP as PCP - General (Family Medicine)   Medications: Outpatient Medications Prior to Visit  Medication Sig   Cholecalciferol (VITAMIN D3) 1000 units CAPS Take 1 capsule by mouth daily.   EPINEPHrine (EPIPEN 2-PAK) 0.3 mg/0.3 mL IJ SOAJ injection Inject 0.3 mg into the muscle as needed for anaphylaxis.   meloxicam (MOBIC) 15 MG tablet Take 15 mg by mouth daily.   RA KRILL OIL 500 MG CAPS Take 1 capsule by mouth daily.   Sod Picosulfate-Mag Ox-Cit Acd (CLENPIQ) 10-3.5-12 MG-GM -GM/175ML SOLN Take 1 kit by mouth as directed.   [DISCONTINUED] methylPREDNISolone (MEDROL) 4 MG TBPK tablet Take by mouth as directed for 6 days (Patient not taking: Reported on 01/13/2023)   No facility-administered medications prior to visit.    Review of Systems See HPI    Last CBC Lab Results  Component Value Date   WBC 9.4 10/15/2022   HGB 14.5 10/15/2022   HCT 41.9 10/15/2022   MCV 86 10/15/2022   MCH 29.9 10/15/2022   RDW 12.8 10/15/2022   PLT 320 10/15/2022   Last metabolic panel Lab Results  Component Value Date   GLUCOSE 115 (H) 10/15/2022   NA 137 10/15/2022   K 4.4 10/15/2022   CL 102 10/15/2022   CO2 18 (L) 10/15/2022   BUN 14 10/15/2022   CREATININE 0.78 10/15/2022   EGFR 95 10/15/2022   CALCIUM 9.7 10/15/2022   PROT 7.2 10/15/2022   ALBUMIN 4.5 10/15/2022   LABGLOB 2.7 10/15/2022   AGRATIO 1.7 10/15/2022   BILITOT 0.5 10/15/2022   ALKPHOS 41 (L) 10/15/2022   AST 34 10/15/2022   ALT 50 (H) 10/15/2022   Last lipids Lab Results  Component Value Date   CHOL 229 (H) 10/15/2022   HDL 30 (L) 10/15/2022   LDLCALC 137 (H) 10/15/2022   TRIG 340 (H) 10/15/2022   CHOLHDL 7.6 (H) 10/15/2022   Last hemoglobin A1c Lab Results  Component Value Date   HGBA1C 5.9 (H) 10/15/2022   Last thyroid functions Lab Results  Component Value Date   TSH 1.340 10/15/2022   Last vitamin D Lab Results  Component Value Date   VD25OH 21.0 (L) 10/15/2022     Objective      Today's Vitals   03/07/23 1621 03/07/23 1631  BP: (Abnormal) 146/87 118/77  Pulse: 72   SpO2: 99%   Weight: 192 lb 12.8 oz (87.5 kg)   Height: 5\' 4"  (1.626 m)    Body mass index is 33.09 kg/m.  BP Readings from Last 3 Encounters:  03/07/23  118/77  01/13/23 (Abnormal) 142/82  10/14/22 (Abnormal) 139/90    Wt Readings from Last 3 Encounters:  03/07/23 192 lb 12.8 oz (87.5 kg)  01/13/23 197 lb (89.4 kg)  10/14/22 201 lb (91.2 kg)     Physical Exam Vitals and nursing note reviewed.  Constitutional:      Appearance: Normal appearance. She is well-developed.  HENT:     Head: Normocephalic and atraumatic.     Right Ear: Tympanic membrane, ear canal and external ear normal.     Left Ear: Tympanic membrane, ear canal and external ear normal.     Nose: Nose normal.     Mouth/Throat:     Mouth: Mucous membranes are moist.     Pharynx: Oropharynx is clear.  Eyes:     Extraocular Movements: Extraocular movements intact.     Conjunctiva/sclera: Conjunctivae normal.     Pupils: Pupils are equal, round, and reactive to light.  Neck:     Vascular: No carotid bruit.  Cardiovascular:     Rate and Rhythm: Normal rate and regular rhythm.     Pulses: Normal pulses.     Heart sounds: Normal heart sounds.  Pulmonary:     Effort: Pulmonary effort is normal.     Breath sounds: Normal breath sounds.  Abdominal:     General: Bowel sounds are normal. There is no distension.     Palpations: Abdomen is soft. There is no mass.     Tenderness: There is no abdominal tenderness. There is no right CVA tenderness, left CVA tenderness, guarding or rebound.  Musculoskeletal:        General: Normal range of motion.     Cervical back: Normal range of motion and neck supple.  Lymphadenopathy:     Cervical: No cervical adenopathy.  Skin:    General: Skin is warm and dry.     Capillary Refill: Capillary refill takes less than 2 seconds.  Neurological:     General: No focal deficit present.      Mental Status: She is alert and oriented to person, place, and time.  Psychiatric:        Mood and Affect: Mood normal.        Behavior: Behavior normal.        Thought Content: Thought content normal.        Judgment: Judgment normal.     Last depression screening scores   Row Labels 03/07/2023    4:23 PM 10/14/2022    2:16 PM 10/09/2021    8:27 AM  PHQ 2/9 Scores   Section Header. No data exists in this row.     PHQ - 2 Score   0 0 0  PHQ- 9 Score   1 0 1   Last fall risk screening   Row Labels 03/07/2023    4:23 PM  Fall Risk    Section Header. No data exists in this row.   Falls in the past year?   0  Number falls in past yr:   0  Injury with Fall?   0  Risk for fall due to :   No Fall Risks  Follow up   Falls evaluation completed     Assessment & Plan     Immunization History  Administered Date(s) Administered   Hepatitis B 04/25/2001   PFIZER Comirnaty(Gray Top)Covid-19 Tri-Sucrose Vaccine 12/14/2019, 06/19/2020   Tdap 10/18/2006, 02/27/2018    Health Maintenance  Topic Date Due   HIV Screening  Never done  Hepatitis C Screening  Never done   COLONOSCOPY (Pts 45-69yrs Insurance coverage will need to be confirmed)  Never done   COVID-19 Vaccine (3 - 2023-24 season) 06/18/2022   PAP SMEAR-Modifier  03/07/2023 (Originally 01/16/2010)   INFLUENZA VACCINE  05/19/2023   DTaP/Tdap/Td (3 - Td or Tdap) 02/28/2028   HPV VACCINES  Aged Out    Discussed health benefits of physical activity, and encouraged her to engage in regular exercise appropriate for her age and condition.  Encounter for general adult medical examination with abnormal findings  Mixed hyperlipidemia Assessment & Plan: Recent lipid panel -   Lipid Panel     Component Value Date/Time   CHOL 229 (H) 10/15/2022 0849   TRIG 340 (H) 10/15/2022 0849   HDL 30 (L) 10/15/2022 0849   CHOLHDL 7.6 (H) 10/15/2022 0849   LDLCALC 137 (H) 10/15/2022 0849   LABVLDL 62 (H) 10/15/2022 0849    Patient has  already eliminated red meat from her diet completely. She has already made heart healthy diet changes. 9 pound weight loss December 2023. Discussed starting statin medication versus diet and lifestyle changes.  She asked that I give her opportunity to work on diet and lifestyle. Advised she strictly follow, low-fat, heart healthy diet for next 7 months.  Recheck fasting lipid panel at that time.  Will treat with statin medication if lipid panel still moderately elevated.  Patient is agreeable with this plan.   Allergy to alpha-gal Assessment & Plan: Patient positive for alpha gal allergy. -She has already eliminated beef, pork, and lamb from her diet.   BMI 33.0-33.9,adult Assessment & Plan: Discussed lowering calorie intake to 1500 calories per day and incorporating exercise into daily routine to help lose weight.    Impaired fasting glucose Assessment & Plan: Hemoglobin A1c 5.9 on recent labs. -Recommend reduction of carbohydrate and sugar intake. -Increase water intake. -Recheck hemoglobin A1c in 7 months for further evaluation.     Return in about 7 months (around 10/07/2023) for HLD - fasting lipids and CMP and HgbA1c a week prior to visit .        Carlean Jews, NP  [facility] at [facility] [phone] (phone) [phone] (fax)  [facility]

## 2023-03-07 NOTE — Assessment & Plan Note (Signed)
Recent lipid panel -   Lipid Panel     Component Value Date/Time   CHOL 229 (H) 10/15/2022 0849   TRIG 340 (H) 10/15/2022 0849   HDL 30 (L) 10/15/2022 0849   CHOLHDL 7.6 (H) 10/15/2022 0849   LDLCALC 137 (H) 10/15/2022 0849   LABVLDL 62 (H) 10/15/2022 0849    Patient has already eliminated red meat from her diet completely. She has already made heart healthy diet changes. 9 pound weight loss December 2023. Discussed starting statin medication versus diet and lifestyle changes.  She asked that I give her opportunity to work on diet and lifestyle. Advised she strictly follow, low-fat, heart healthy diet for next 7 months.  Recheck fasting lipid panel at that time.  Will treat with statin medication if lipid panel still moderately elevated.  Patient is agreeable with this plan.

## 2023-03-07 NOTE — Assessment & Plan Note (Signed)
Discussed lowering calorie intake to 1500 calories per day and incorporating exercise into daily routine to help lose weight.  °

## 2023-03-07 NOTE — Assessment & Plan Note (Signed)
Patient positive for alpha gal allergy. -She has already eliminated beef, pork, and lamb from her diet.

## 2023-03-08 ENCOUNTER — Ambulatory Visit (AMBULATORY_SURGERY_CENTER): Payer: BC Managed Care – PPO

## 2023-03-08 VITALS — Ht 64.0 in | Wt 192.0 lb

## 2023-03-08 DIAGNOSIS — Z1212 Encounter for screening for malignant neoplasm of rectum: Secondary | ICD-10-CM

## 2023-03-08 DIAGNOSIS — K580 Irritable bowel syndrome with diarrhea: Secondary | ICD-10-CM

## 2023-03-08 DIAGNOSIS — R7989 Other specified abnormal findings of blood chemistry: Secondary | ICD-10-CM

## 2023-03-08 DIAGNOSIS — K760 Fatty (change of) liver, not elsewhere classified: Secondary | ICD-10-CM

## 2023-03-08 DIAGNOSIS — Z1211 Encounter for screening for malignant neoplasm of colon: Secondary | ICD-10-CM | POA: Insufficient documentation

## 2023-03-08 NOTE — Progress Notes (Signed)
No egg or soy allergy known to patient  No issues known to pt with past sedation with any surgeries or procedures Patient denies ever being told they had issues or difficulty with intubation  No FH of Malignant Hyperthermia Pt is not on diet pills Pt is not on  home 02  Pt is not on blood thinners  Pt reports intermit constipation as her normal  No A fib or A flutter Have any cardiac testing pending--no  Pt instructed to use Single[employer]com or GoodRx for a price reduction on prep   Pt is ambulatory   Patient's chart reviewed by Cathlyn Parsons CNRA prior to previsit and patient appropriate for the [facility].  Previsit completed and red dot placed by patient's name on their procedure day (on provider's schedule).     PV completed with patient pt already has clenpiq from [facility]. New instructions sent via mychart and to mailing address.

## 2023-03-16 ENCOUNTER — Encounter: Payer: Self-pay | Admitting: Gastroenterology

## 2023-03-29 ENCOUNTER — Ambulatory Visit (AMBULATORY_SURGERY_CENTER): Payer: BC Managed Care – PPO | Admitting: Gastroenterology

## 2023-03-29 ENCOUNTER — Encounter: Payer: Self-pay | Admitting: Gastroenterology

## 2023-03-29 VITALS — BP 130/70 | HR 58 | Temp 98.4°F | Resp 19 | Ht 64.0 in | Wt 192.0 lb

## 2023-03-29 DIAGNOSIS — D122 Benign neoplasm of ascending colon: Secondary | ICD-10-CM | POA: Diagnosis not present

## 2023-03-29 DIAGNOSIS — K635 Polyp of colon: Secondary | ICD-10-CM

## 2023-03-29 DIAGNOSIS — Z1211 Encounter for screening for malignant neoplasm of colon: Secondary | ICD-10-CM | POA: Diagnosis not present

## 2023-03-29 MED ORDER — SODIUM CHLORIDE 0.9 % IV SOLN: 500.0000 mL | Freq: Once | INTRAVENOUS | Status: DC

## 2023-03-29 NOTE — Progress Notes (Signed)
Chief Complaint: For colonoscopy  Referring Provider:  Carlean Jews, NP      ASSESSMENT AND PLAN;   #1. CRC screening  #2. IBS-C   Plan: -Colon with 2 day prep today   HPI:    Marisa Chen is a 47 y.o. female  With recently Dx alpha gal, gluten intolerant (being followed by allergy), obesity, HLD, migraines, s/p cholecystectomy and hysterectomy  With longstanding history of GI problems-including alternating diarrhea and constipation.  Underwent EGD/colonoscopy over 10 years ago which was neg.  Reports are awaited (?[facility] or Dr Loreta Ave)  Here for screening colonoscopy  Describes alternating constipation and diarrhea-more constipation with pellet-like stools, abdominal bloating.  Worse constipation has been 1 BM in 1 week.  She had to take laxatives. Generalized abdominal bloating with lower abdominal pain which gets better with BMs.  Occasional heartburn but not bad.  She has been diagnosed as having gluten sensitivity and has been on decreased gluten diet-not completely gluten-free.  She also been diagnosed with alpha gal-staying away from red meats.  Has EpiPen but did not have to use it.  Denies having any melena or hematochezia.  No fever chills or night sweats.   He had abnormal ALT dating back to 2017.  Has been told to lose weight.   Occ itching and had skin lesions.  No easy bruisability, intake of OTC meds including diet pills, herbal medications, anabolic steroids or Tylenol. There is no H/O blood transfusions, IVDA or FH of liver disease. No jaundice, dark urine or pale stools. No alcohol abuse.     Latest Ref Rng & Units 10/15/2022    8:49 AM 02/21/2018   12:00 AM 08/14/2016   12:00 AM  Hepatic Function  Total Protein 6.0 - 8.5 g/dL 7.2     Albumin 3.9 - 4.9 g/dL 4.5     AST 0 - 40 IU/L 34  24     29      ALT 0 - 32 IU/L 50  41     51      Alk Phosphatase 44 - 121 IU/L 41  38     46      Total Bilirubin 0.0 - 1.2 mg/dL 0.5        This result  is from an external source.    Wt Readings from Last 3 Encounters:  03/29/23 192 lb (87.1 kg)  03/08/23 192 lb (87.1 kg)  03/07/23 192 lb 12.8 oz (87.5 kg)     Past Medical History:  Diagnosis Date   Allergy    Cholelithiasis    Decreased libido    FHx: migraine headaches    Hyperlipidemia    Overweight(278.02)     Past Surgical History:  Procedure Laterality Date   ABDOMINAL HYSTERECTOMY     BLADDER SUSPENSION     CHOLECYSTECTOMY  03/15/2012   TONSILLECTOMY      Family History  Problem Relation Age of Onset   Heart disease Mother    Hyperlipidemia Mother    Crohn's disease Mother    Cancer Mother        vulva   Heart attack Father    Diabetes Father    Hyperlipidemia Father    Hypertension Father    Asthma Sister    Cancer Maternal Grandmother        Breast Cancer   Heart disease Maternal Grandfather    Heart attack Maternal Grandfather    Hyperlipidemia Maternal Grandfather    Hypertension Maternal  Grandfather    Diabetes Paternal Grandmother    Heart attack Paternal Grandfather    Hyperlipidemia Paternal Grandfather    Esophageal cancer Neg Hx    Colon cancer Neg Hx    Stomach cancer Neg Hx    Rectal cancer Neg Hx     Social History   Tobacco Use   Smoking status: Never   Smokeless tobacco: Never  Vaping Use   Vaping Use: Never used  Substance Use Topics   Alcohol use: Yes    Comment: occassionally   Drug use: No    Current Outpatient Medications  Medication Sig Dispense Refill   Cholecalciferol (VITAMIN D3) 1000 units CAPS Take 1 capsule by mouth daily.     meloxicam (MOBIC) 15 MG tablet Take 15 mg by mouth daily.     RA KRILL OIL 500 MG CAPS Take 1 capsule by mouth daily.     EPINEPHrine (EPIPEN 2-PAK) 0.3 mg/0.3 mL IJ SOAJ injection Inject 0.3 mg into the muscle as needed for anaphylaxis. 1 each 1   Current Facility-Administered Medications  Medication Dose Route Frequency Provider Last Rate Last Admin   0.9 %  sodium chloride infusion   500 mL Intravenous Once Lynann Bologna, MD        Allergies  Allergen Reactions   Bee Venom Hives    Burning and Edema   Ciprofloxacin    Penicillins     Review of Systems:  Constitutional: Denies fever, chills, diaphoresis, appetite change and has fatigue.  HEENT: Has multiple allergies.  Occasional vision changes. Respiratory: Denies SOB, DOE, cough, chest tightness,  and wheezing.   Cardiovascular: Denies chest pain, palpitations and leg swelling.  Genitourinary: Denies dysuria, urgency, frequency, hematuria, flank pain and difficulty urinating.  Musculoskeletal: Denies myalgias, has lower back pain, joint swelling, arthralgias and gait problem.  Skin: No rash.  Neurological: Denies dizziness, seizures, syncope, weakness, light-headedness, numbness and has headaches.  Hematological: Denies adenopathy. Easy bruising, personal or family bleeding history  Psychiatric/Behavioral: No anxiety or depression     Physical Exam:    BP 130/76   Pulse (!) 58   Temp 98.4 F (36.9 C)   Ht 5\' 4"  (1.626 m)   Wt 192 lb (87.1 kg)   SpO2 98%   BMI 32.96 kg/m  Wt Readings from Last 3 Encounters:  03/29/23 192 lb (87.1 kg)  03/08/23 192 lb (87.1 kg)  03/07/23 192 lb 12.8 oz (87.5 kg)   Constitutional:  Well-developed, in no acute distress. Psychiatric: Normal mood and affect. Behavior is normal. HEENT: Pupils normal.  Conjunctivae are normal. No scleral icterus. Cardiovascular: Normal rate, regular rhythm. No edema Pulmonary/chest: Effort normal and breath sounds normal. No wheezing, rales or rhonchi. Abdominal: Soft, nondistended. Nontender. Bowel sounds active throughout. There are no masses palpable.  Liver is palpated 2 cm below the costal margin. Rectal: Deferred Neurological: Alert and oriented to person place and time. Skin: Skin is warm and dry. No rashes noted.  Data Reviewed: I have personally reviewed following labs and imaging studies  CBC:    Latest Ref Rng & Units  10/15/2022    8:49 AM 02/21/2018   12:00 AM 08/14/2016   12:00 AM  CBC  WBC 3.4 - 10.8 x10E3/uL 9.4  7.6     8.8      Hemoglobin 11.1 - 15.9 g/dL 14.7  82.9     56.2      Hematocrit 34.0 - 46.6 % 41.9  42     43  Platelets 150 - 450 x10E3/uL 320  303     313         This result is from an external source.    CMP:    Latest Ref Rng & Units 10/15/2022    8:49 AM 02/21/2018   12:00 AM 08/14/2016   12:00 AM  CMP  Glucose 70 - 99 mg/dL 409     BUN 6 - 24 mg/dL 14  13     16       Creatinine 0.57 - 1.00 mg/dL 8.11  0.7     0.7      Sodium 134 - 144 mmol/L 137  137     139      Potassium 3.5 - 5.2 mmol/L 4.4  4.3     4.5      Chloride 96 - 106 mmol/L 102     CO2 20 - 29 mmol/L 18     Calcium 8.7 - 10.2 mg/dL 9.7     Total Protein 6.0 - 8.5 g/dL 7.2     Total Bilirubin 0.0 - 1.2 mg/dL 0.5     Alkaline Phos 44 - 121 IU/L 41  38     46      AST 0 - 40 IU/L 34  24     29      ALT 0 - 32 IU/L 50  41     51         This result is from an external source.        Edman Circle, MD 03/29/2023, 9:09 AM  Cc: Carlean Jews, NP

## 2023-03-29 NOTE — Patient Instructions (Signed)
Thank you for coming in to see [place] today! Resume your diet and medications today. Return to regular daily activities tomorrow. Surveillance colonoscopy recommended.  Will determine date once biopsies from today are final.   YOU HAD AN ENDOSCOPIC PROCEDURE TODAY AT THE [facility]:   Refer to the procedure report that was given to you for any specific questions about what was found during the examination.  If the procedure report does not answer your questions, please call your gastroenterologist to clarify.  If you requested that your care partner not be given the details of your procedure findings, then the procedure report has been included in a sealed envelope for you to review at your convenience later.  YOU SHOULD EXPECT: Some feelings of bloating in the abdomen. Passage of more gas than usual.  Walking can help get rid of the air that was put into your GI tract during the procedure and reduce the bloating. If you had a lower endoscopy (such as a colonoscopy or flexible sigmoidoscopy) you may notice spotting of blood in your stool or on the toilet paper. If you underwent a bowel prep for your procedure, you may not have a normal bowel movement for a few days.  Please Note:  You might notice some irritation and congestion in your nose or some drainage.  This is from the oxygen used during your procedure.  There is no need for concern and it should clear up in a day or so.  SYMPTOMS TO REPORT IMMEDIATELY:  Following lower endoscopy (colonoscopy or flexible sigmoidoscopy):  Excessive amounts of blood in the stool  Significant tenderness or worsening of abdominal pains  Swelling of the abdomen that is new, acute  Fever of 100F or higher    For urgent or emergent issues, a gastroenterologist can be reached at any hour by calling [phone]. Do not use MyChart messaging for urgent concerns.    DIET:  We do recommend a small meal at first, but then you may proceed to your  regular diet.  Drink plenty of fluids but you should avoid alcoholic beverages for 24 hours.  ACTIVITY:  You should plan to take it easy for the rest of today and you should NOT DRIVE or use heavy machinery until tomorrow (because of the sedation medicines used during the test).    FOLLOW UP: Our staff will call the number listed on your records the next business day following your procedure.  We will call around 7:15- 8:00 am to check on you and address any questions or concerns that you may have regarding the information given to you following your procedure. If we do not reach you, we will leave a message.     If any biopsies were taken you will be contacted by phone or by letter within the next 1-3 weeks.  Please call us at [phone] if you have not heard about the biopsies in 3 weeks.    SIGNATURES/CONFIDENTIALITY: You and/or your care partner have signed paperwork which will be entered into your electronic medical record.  These signatures attest to the fact that that the information above on your After Visit Summary has been reviewed and is understood.  Full responsibility of the confidentiality of this discharge information lies with you and/or your care-partner.

## 2023-03-29 NOTE — Op Note (Signed)
[facility] Patient Name: Marisa Chen Procedure Date: 03/29/2023 9:09 AM MRN: [id number] Endoscopist: Lynann Bologna , MD, [id number] Age: 47 Referring MD:  Date of Birth: [birth year] Gender: Female Account #: [id number] Procedure:                Colonoscopy Indications:              Screening for colorectal malignant neoplasm Medicines:                Monitored Anesthesia Care Procedure:                Pre-Anesthesia Assessment:                           - Prior to the procedure, a History and Physical                            was performed, and patient medications and                            allergies were reviewed. The patient's tolerance of                            previous anesthesia was also reviewed. The risks                            and benefits of the procedure and the sedation                            options and risks were discussed with the patient.                            All questions were answered, and informed consent                            was obtained. Prior Anticoagulants: The patient has                            taken no anticoagulant or antiplatelet agents. ASA                            Grade Assessment: II - A patient with mild systemic                            disease. After reviewing the risks and benefits,                            the patient was deemed in satisfactory condition to                            undergo the procedure.                           After obtaining informed consent, the colonoscope  was passed under direct vision. Throughout the                            procedure, the patient's blood pressure, pulse, and                            oxygen saturations were monitored continuously. The                            [id number]-HQ190L Colonoscope [id number] was introduced                            through the anus and advanced to the the cecum,                            identified by  appendiceal orifice and ileocecal                            valve. The colonoscopy was performed without                            difficulty. The patient tolerated the procedure                            well. The quality of the bowel preparation was                            good. The ileocecal valve, appendiceal orifice, and                            rectum were photographed. Scope In: 9:17:02 AM Scope Out: 9:31:57 AM Scope Withdrawal Time: 0 hours 10 minutes 10 seconds  Total Procedure Duration: 0 hours 14 minutes 55 seconds  Findings:                 A 2 mm polyp was found in the mid ascending colon.                            The polyp was sessile. The polyp was removed with a                            cold biopsy forceps. Resection and retrieval were                            complete.                           Non-bleeding internal hemorrhoids were found during                            retroflexion. The hemorrhoids were small and Grade                            I (internal hemorrhoids that do not prolapse).  The exam was otherwise without abnormality on                            direct and retroflexion views. Complications:            No immediate complications. Estimated Blood Loss:     Estimated blood loss: none. Impression:               - One 2 mm polyp in the mid ascending colon,                            removed with a cold biopsy forceps. Resected and                            retrieved.                           - Non-bleeding internal hemorrhoids.                           - The examination was otherwise normal on direct                            and retroflexion views.                           - The GI Genius (intelligent endoscopy module),                            computer-aided polyp detection system powered by AI                            was utilized to detect colorectal polyps through                            enhanced  visualization during colonoscopy. Recommendation:           - Patient has a contact number available for                            emergencies. The signs and symptoms of potential                            delayed complications were discussed with the                            patient. Return to normal activities tomorrow.                            Written discharge instructions were provided to the                            patient.                           - Resume previous diet.                           -  Continue present medications.                           - Await pathology results.                           - Repeat colonoscopy for surveillance based on                            pathology results.                           - The findings and recommendations were discussed                            with the patient's family. Lynann Bologna, MD 03/29/2023 9:36:01 AM This report has been signed electronically.

## 2023-03-29 NOTE — Progress Notes (Signed)
Vss nad trans to pacu 

## 2023-03-29 NOTE — Progress Notes (Signed)
Called to room to assist during endoscopic procedure.  Patient ID and intended procedure confirmed with present staff. Received instructions for my participation in the procedure from the performing physician.  

## 2023-03-29 NOTE — Progress Notes (Signed)
Pt's states no medical or surgical changes since previsit or office visit. 

## 2023-03-30 ENCOUNTER — Telehealth: Payer: Self-pay

## 2023-03-30 NOTE — Telephone Encounter (Signed)
  Follow up Call-     03/29/2023    8:28 AM  Call back number  Post procedure Call Back phone  # [phone]  Permission to leave phone message Yes     Patient questions:  Do you have a fever, pain , or abdominal swelling? No. Pain Score  0 *  Have you tolerated food without any problems? Yes.    Have you been able to return to your normal activities? Yes.    Do you have any questions about your discharge instructions: Diet   No. Medications  No. Follow up visit  No.  Do you have questions or concerns about your Care? No.  Actions: * If pain score is 4 or above: No action needed, pain <4.

## 2023-04-01 ENCOUNTER — Encounter: Payer: Self-pay | Admitting: Gastroenterology

## 2023-04-07 ENCOUNTER — Encounter: Payer: Self-pay | Admitting: Gastroenterology

## 2023-04-19 ENCOUNTER — Encounter: Payer: Self-pay | Admitting: Gastroenterology

## 2023-04-19 ENCOUNTER — Ambulatory Visit: Payer: BC Managed Care – PPO | Admitting: Gastroenterology

## 2023-04-19 VITALS — BP 122/82 | HR 72 | Ht 64.0 in | Wt 192.2 lb

## 2023-04-19 DIAGNOSIS — K582 Mixed irritable bowel syndrome: Secondary | ICD-10-CM

## 2023-04-19 DIAGNOSIS — R140 Abdominal distension (gaseous): Secondary | ICD-10-CM

## 2023-04-19 DIAGNOSIS — R7989 Other specified abnormal findings of blood chemistry: Secondary | ICD-10-CM | POA: Diagnosis not present

## 2023-04-19 DIAGNOSIS — K760 Fatty (change of) liver, not elsewhere classified: Secondary | ICD-10-CM | POA: Diagnosis not present

## 2023-04-19 DIAGNOSIS — Z889 Allergy status to unspecified drugs, medicaments and biological substances status: Secondary | ICD-10-CM

## 2023-04-19 NOTE — Patient Instructions (Addendum)
_______________________________________________________  If your blood pressure at your visit was 140/90 or greater, please contact your primary care physician to follow up on this.  _______________________________________________________  If you are age 46 or older, your body mass index should be between 23-30. Your Body mass index is 33 kg/m. If this is out of the aforementioned range listed, please consider follow up with your Primary Care Provider.  If you are age 55 or younger, your body mass index should be between 19-25. Your Body mass index is 33 kg/m. If this is out of the aformentioned range listed, please consider follow up with your Primary Care Provider.   ________________________________________________________  The [facility] providers would like to encourage you to use [facility] to communicate with providers for non-urgent requests or questions.  Due to long hold times on the telephone, sending your provider a message by [facility] may be a faster and more efficient way to get a response.  Please allow 48 business hours for a response.  Please remember that this is for non-urgent requests.  _______________________________________________________  Your provider has requested that you go to the basement level for lab work before leaving today. Press "B" on the elevator. The lab is located at the first door on the left as you exit the elevator.  A referral has been sent to asthma and allergy clinic. Please call them in 2 weeks if you haven't heard anything from them. They can draw your lab work as well for [place]. Please remind them.  Please purchase the following medications over the counter and take as directed: Miralax 0.5 capful daily  You have been scheduled for an appointment with Dr. Chales Abrahams on 08-22-2023  at 3:40pm  . Please arrive 10 minutes early for your appointment.  Thank you,  Dr. Lynann Bologna

## 2023-04-19 NOTE — Progress Notes (Signed)
Chief Complaint: For colonoscopy  Referring Provider:  Carlean Jews, NP      ASSESSMENT AND PLAN;   #1.IBS-C with alt diarrhea with bloating. Multiple allergies-has alpha gal, also positive for allergies to egg white, wheat IgE, peanut IgE and soybean IgE  #3. Abn ALT likely d/t fatty liver. USE 3.9 02/2023  #3. Neg colon 03/2023. Rpt 10 yrs  Plan: -Appt with allergist -Miralax 1/2 cap every day -Check LFTs (add) -Encouraged her to lose weight. -FU in 4 months.  If still with abnormal LFTs, further workup     HPI:    Marisa Chen is a 47 y.o. 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  Negative screening colonoscopy 04/07/2023 except for small hyperplastic polyp.  Recommended to repeat colonoscopy in 10 years.  She has multiple food allergies -On blood tests, positive for alpha gal.  She was also positive for allergies to egg white, wheat IgE, peanut IgE and soybean IgE  Has done very well from GI standpoint overall.    She has been trying to lose weight.  Describes alternating constipation and diarrhea-more constipation with pellet-like stools, abdominal bloating.  Worse constipation has been 1 BM in 1 week.  She had to take laxatives. Generalized abdominal bloating with lower abdominal pain which gets better with BMs.  Occasional heartburn but not bad.  She has been diagnosed as having gluten sensitivity and has been on decreased gluten diet-not completely gluten-free.  She also been diagnosed with alpha gal-staying-staying away from red meats.  Has EpiPen but did not have to use it.  Denies having any melena or hematochezia.  No fever chills or night sweats.   He had abnormal ALT dating back to 2017.  Has been told to lose weight.   Occ itching and had skin lesions.  No easy  bruisability, intake of OTC meds including diet pills, herbal medications, anabolic steroids or Tylenol. There is no H/O blood transfusions, IVDA or FH of liver disease. No jaundice, dark urine or pale stools. No alcohol abuse.  Wt Readings from Last 3 Encounters:  04/19/23 192 lb 4 oz (87.2 kg)  03/29/23 192 lb (87.1 kg)  03/08/23 192 lb (87.1 kg)        Latest Ref Rng & Units 10/15/2022    8:49 AM 02/21/2018   12:00 AM 08/14/2016   12:00 AM  Hepatic Function  Total Protein 6.0 - 8.5 g/dL 7.2     Albumin 3.9 - 4.9 g/dL 4.5     AST 0 - 40 IU/L 34  24     29      ALT 0 - 32 IU/L 50  41     51      Alk Phosphatase 44 - 121 IU/L 41  38     46      Total Bilirubin 0.0 - 1.2 mg/dL 0.5        This result is from an external source.   Past GI workup: Colon 03/2023 - One 2 mm polyp in the mid ascending colon, removed with a cold biopsy forceps. Resected and retrieved. - Non-bleeding internal hemorrhoids. - The examination was otherwise normal on direct and retroflexion views. -Bx- neg -Rpt 10 yrs. earlier if with any new problems or change in family history  [place] Abdo with USE 02/27/2023 Diffuse fatty infiltration of the liver. Median kPa:  3.9 (high probability of being normal) Past Medical History:  Diagnosis Date   Allergy    Cholelithiasis    Decreased libido    FHx: migraine headaches    Hyperlipidemia    Overweight(278.02)     Past Surgical History:  Procedure Laterality Date   ABDOMINAL HYSTERECTOMY     BLADDER SUSPENSION     CHOLECYSTECTOMY  03/15/2012   TONSILLECTOMY      Family History  Problem Relation Age of Onset   Heart disease Mother    Hyperlipidemia Mother    Crohn's disease Mother    Cancer Mother        vulva   Heart attack Father    Diabetes Father    Hyperlipidemia Father    Hypertension Father    Asthma Sister    Cancer Maternal Grandmother        Breast Cancer   Heart disease Maternal Grandfather    Heart attack Maternal Grandfather     Hyperlipidemia Maternal Grandfather    Hypertension Maternal Grandfather    Diabetes Paternal Grandmother    Heart attack Paternal Grandfather    Hyperlipidemia Paternal Grandfather    Esophageal cancer Neg Hx    Colon cancer Neg Hx    Stomach cancer Neg Hx    Rectal cancer Neg Hx     Social History   Tobacco Use   Smoking status: Never   Smokeless tobacco: Never  Vaping Use   Vaping Use: Never used  Substance Use Topics   Alcohol use: Yes    Comment: occassionally   Drug use: No    Current Outpatient Medications  Medication Sig Dispense Refill   Cholecalciferol (VITAMIN D3) 1000 units CAPS Take 1 capsule by mouth daily.     meloxicam (MOBIC) 15 MG tablet Take 15 mg by mouth daily.     RA KRILL OIL 500 MG CAPS Take 1 capsule by mouth daily.     EPINEPHrine (EPIPEN 2-PAK) 0.3 mg/0.3 mL IJ SOAJ injection Inject 0.3 mg into the muscle as needed for anaphylaxis. (Patient not taking: Reported on 04/19/2023) 1 each 1   No current facility-administered medications for this visit.    Allergies  Allergen Reactions   Bee Venom Hives    Burning and Edema   Ciprofloxacin    Penicillins     Review of Systems:  neg     Physical Exam:    BP 122/82 (BP Location: Left Arm, Patient Position: Sitting, Cuff Size: Normal)   Pulse 72   Ht 5\' 4"  (1.626 m)   Wt 192 lb 4 oz (87.2 kg)   BMI 33.00 kg/m  Wt Readings from Last 3 Encounters:  04/19/23 192 lb 4 oz (87.2 kg)  03/29/23 192 lb (87.1 kg)  03/08/23 192 lb (87.1 kg)   Constitutional:  Well-developed, in no acute distress. Psychiatric: Normal mood and affect. Behavior is normal. HEENT: Pupils normal.  Conjunctivae are normal. No scleral icterus. Cardiovascular: Normal rate, regular rhythm. No edema Pulmonary/chest: Effort normal and breath sounds normal. No wheezing, rales or rhonchi. Abdominal: Soft, nondistended. Nontender. Bowel sounds active throughout. There are no masses palpable.  Liver is palpated 2 cm below the  costal margin. Rectal: Deferred Neurological: Alert and oriented to person place and time. Skin: Skin is warm and dry. No rashes noted.  Data Reviewed: I have personally reviewed following labs and imaging studies  CBC:    Latest Ref Rng & Units 10/15/2022    8:49 AM 02/21/2018   12:00 AM 08/14/2016  12:00 AM  CBC  WBC 3.4 - 10.8 x10E3/uL 9.4  7.6     8.8      Hemoglobin 11.1 - 15.9 g/dL 16.1  09.6     04.5      Hematocrit 34.0 - 46.6 % 41.9  42     43      Platelets 150 - 450 x10E3/uL 320  303     313         This result is from an external source.    CMP:    Latest Ref Rng & Units 10/15/2022    8:49 AM 02/21/2018   12:00 AM 08/14/2016   12:00 AM  CMP  Glucose 70 - 99 mg/dL 409     BUN 6 - 24 mg/dL 14  13     16       Creatinine 0.57 - 1.00 mg/dL 8.11  0.7     0.7      Sodium 134 - 144 mmol/L 137  137     139      Potassium 3.5 - 5.2 mmol/L 4.4  4.3     4.5      Chloride 96 - 106 mmol/L 102     CO2 20 - 29 mmol/L 18     Calcium 8.7 - 10.2 mg/dL 9.7     Total Protein 6.0 - 8.5 g/dL 7.2     Total Bilirubin 0.0 - 1.2 mg/dL 0.5     Alkaline Phos 44 - 121 IU/L 41  38     46      AST 0 - 40 IU/L 34  24     29      ALT 0 - 32 IU/L 50  41     51         This result is from an external source.        Edman Circle, MD 04/19/2023, 4:07 PM  Cc: Carlean Jews, NP

## 2023-07-15 ENCOUNTER — Ambulatory Visit: Payer: BC Managed Care – PPO | Admitting: Internal Medicine

## 2023-07-15 ENCOUNTER — Other Ambulatory Visit: Payer: Self-pay

## 2023-07-15 ENCOUNTER — Encounter: Payer: Self-pay | Admitting: Internal Medicine

## 2023-07-15 VITALS — BP 152/98 | HR 79 | Temp 98.7°F | Ht 64.57 in | Wt 194.2 lb

## 2023-07-15 DIAGNOSIS — J3081 Allergic rhinitis due to animal (cat) (dog) hair and dander: Secondary | ICD-10-CM

## 2023-07-15 DIAGNOSIS — K588 Other irritable bowel syndrome: Secondary | ICD-10-CM

## 2023-07-15 DIAGNOSIS — J301 Allergic rhinitis due to pollen: Secondary | ICD-10-CM

## 2023-07-15 DIAGNOSIS — J3089 Other allergic rhinitis: Secondary | ICD-10-CM | POA: Diagnosis not present

## 2023-07-15 DIAGNOSIS — Z91018 Allergy to other foods: Secondary | ICD-10-CM

## 2023-07-15 MED ORDER — LEVOCETIRIZINE DIHYDROCHLORIDE 5 MG PO TABS: 5.0000 mg | ORAL_TABLET | Freq: Every day | ORAL | 5 refills | Status: DC | PRN

## 2023-07-15 MED ORDER — EPINEPHRINE 0.3 MG/0.3ML IJ SOAJ: 0.3000 mg | INTRAMUSCULAR | 1 refills | Status: AC | PRN

## 2023-07-15 MED ORDER — AZELASTINE HCL 0.1 % NA SOLN: 1.0000 | Freq: Two times a day (BID) | NASAL | 5 refills | Status: DC | PRN

## 2023-07-15 MED ORDER — OLOPATADINE HCL 0.2 % OP SOLN: 1.0000 [drp] | Freq: Every day | OPHTHALMIC | 5 refills | Status: DC | PRN

## 2023-07-15 MED ORDER — FLUTICASONE PROPIONATE 50 MCG/ACT NA SUSP: 2.0000 | Freq: Every day | NASAL | 5 refills | Status: DC

## 2023-07-15 NOTE — Progress Notes (Signed)
NEW PATIENT  Date of Service/Encounter:  07/15/23  Consult requested by: Carlean Jews, NP   Subjective:   Marisa Chen (DOB: 11-07-1975) is a 47 y.o. female who presents to the clinic on 07/15/2023 with a chief complaint of Allergy Testing (States that certain foods make her stomach upset and causes bloating) and Nasal Congestion (Seasonal allergies) .    History obtained from: chart review and patient.  Rhinitis:  Started around 30s.  Symptoms include: nasal congestion, rhinorrhea, post nasal drainage, watery eyes, and itchy eyes  Occurs seasonally-Spring  Potential triggers: not sure   Treatments tried:  Zyrtec D PRN;  last use was last Spring  Nose sprays  Previous allergy testing: no  History of sinus surgery: no Nonallergic triggers: none  Concern for Food Allergy:  Diagnosed with alpha gal in December 2023.  Was bitten by a tick.  Had hives also.  She also has chronic GI issues with constipation/diarrhea/abdominal pain. Avoided mammalian meat.  Does not have an Epipen.    Prior to that reports having gluten allergy; celiac testing was negative.  Avoids barley; eats some wheat products like small amount of pasta, bread.   Also had allergy test done that showed positivity to eggs, wheat, peanut, soy.  Reports previously she did not have trouble with soy, scrambled eggs or peanut but now avoids it due to the test.  Also reports no issues with scrambled eggs previously but again Also reports dairy products cause bloating and stomach pain. Has IBS, followed by GI.   Past Medical History: Past Medical History:  Diagnosis Date   Allergy    Cholelithiasis    Decreased libido    FHx: migraine headaches    Hyperlipidemia    Overweight(278.02)    Urticaria    Past Surgical History: Past Surgical History:  Procedure Laterality Date   ABDOMINAL HYSTERECTOMY     BLADDER SUSPENSION     CHOLECYSTECTOMY  03/15/2012   TONSILLECTOMY      Family History: Family  History  Problem Relation Age of Onset   Heart disease Mother    Hyperlipidemia Mother    Crohn's disease Mother    Cancer Mother        vulva   Allergic rhinitis Father    Heart attack Father    Diabetes Father    Hyperlipidemia Father    Hypertension Father    Asthma Sister    Cancer Maternal Grandmother        Breast Cancer   Heart disease Maternal Grandfather    Heart attack Maternal Grandfather    Hyperlipidemia Maternal Grandfather    Hypertension Maternal Grandfather    Diabetes Paternal Grandmother    Heart attack Paternal Grandfather    Hyperlipidemia Paternal Grandfather    Esophageal cancer Neg Hx    Colon cancer Neg Hx    Stomach cancer Neg Hx    Rectal cancer Neg Hx     Social History:  Flooring in bedroom: wood Pets: Medical laboratory scientific officer and dog Tobacco use/exposure: none Job: Geologist, engineering kindergarten   Medication List:  Allergies as of 07/15/2023       Reactions   Bee Venom Hives   Burning and Edema   Ciprofloxacin    Penicillins         Medication List        Accurate as of July 15, 2023  3:52 PM. If you have any questions, ask your nurse or doctor.  STOP taking these medications    EPINEPHrine 0.3 mg/0.3 mL Soaj injection Commonly known as: EpiPen 2-Pak Stopped by: Birder Robson       TAKE these medications    meloxicam 15 MG tablet Commonly known as: MOBIC Take 15 mg by mouth daily.   RA Krill Oil 500 MG Caps Take 1 capsule by mouth daily.   Vitamin D3 25 MCG (1000 UT) Caps Take 1 capsule by mouth daily.         REVIEW OF SYSTEMS: Pertinent positives and negatives discussed in HPI.   Objective:   Physical Exam: BP (!) 152/98 (BP Location: Right Arm, Cuff Size: Large)   Pulse 79   Temp 98.7 F (37.1 C)   Ht 5' 4.57" (1.64 m)   Wt 194 lb 3.2 oz (88.1 kg)   SpO2 98%   BMI 32.75 kg/m  Body mass index is 32.75 kg/m. GEN: alert, well developed HEENT: clear conjunctiva, TM grey and translucent, nose with +  moderate inferior turbinate hypertrophy, pink nasal mucosa, slight clear rhinorrhea, no cobblestoning HEART: regular rate and rhythm, no murmur LUNGS: clear to auscultation bilaterally, no coughing, unlabored respiration ABDOMEN: soft, non distended  SKIN: no rashes or lesions  Reviewed:  04/19/2023: seen by GI Dr Chales Abrahams for IBS, non celiac gluten intolerance, multiple food allergies. Had positive alpha gal and food panel that showed mild reactivity to eggs, wheat, peanut, soy.  Discussed use of Miralax, weight loss.   03/07/2023: seen by PCP Hermenia Fiscal NP for hives.  Also food allergies based on lab testing.  Avoided beef/pork/lamb/peanuts/wheat.   10/14/2022: seen by PCP for hives and facial swelling.  Given medrol, Epipen, triamcinolone.   Skin Testing:  Skin prick testing was placed, which includes aeroallergens/foods, histamine control, and saline control.  Verbal consent was obtained prior to placing test.  Patient tolerated procedure well.  Allergy testing results were read and interpreted by myself, documented by clinical staff. Adequate positive and negative control.  Results discussed with patient/family.  Airborne Adult Perc - 07/15/23 1456     Time Antigen Placed 1456    Allergen Manufacturer Waynette Buttery    Location Back    Number of Test 55    1. Control-Buffer 50% Glycerol Negative    2. Control-Histamine 3+    3. Bahia 3+    4. [place] 3+    5. Johnson 3+    6. Kentucky Blue 3+    7. Meadow Fescue 3+    8. Perennial Rye 3+    9. Timothy 3+    10. Ragweed Mix 3+    11. Cocklebur 3+    12. Plantain,  English 3+    13. Baccharis 2+    14. Dog Fennel 3+    15. Russian Thistle 3+    16. Lamb's Quarters 3+    17. Sheep Sorrell 3+    18. Rough Pigweed 3+    19. Marsh Elder, Rough Negative    20. Mugwort, Common 3+    21. Box, Elder 3+    22. Cedar, red 3+    23. Sweet Gum 3+    24. Pecan Pollen 3+    25. Pine Mix Negative    26. Walnut, Black Pollen 3+    27. Red Mulberry  3+    28. Ash Mix 3+    29. Birch Mix 3+    30. Beech American 3+    31. Cottonwood, Eastern 3+    32. Hickory, White 3+  33. Maple Mix 3+    34. Oak, [place] Mix 3+    35. Sycamore Eastern 3+    36. Alternaria Alternata 2+    37. Cladosporium Herbarum 2+    38. Aspergillus Mix 2+    39. Penicillium Mix Negative    40. Bipolaris Sorokiniana (Helminthosporium) Negative    41. Drechslera Spicifera (Curvularia) Negative    42. Mucor Plumbeus Negative    43. Fusarium Moniliforme 2+    44. Aureobasidium Pullulans (pullulara) Negative    45. Rhizopus Oryzae Negative    46. Botrytis Cinera 2+    47. Epicoccum Nigrum 2+    48. Phoma Betae Negative    49. Dust Mite Mix 3+    50. Cat Hair 10,000 BAU/ml 3+    51.  Dog Epithelia Negative    52. Mixed Feathers Negative    53. Horse Epithelia Negative    54. Cockroach, German 3+    55. Tobacco Leaf 2+               Assessment:   1. Other irritable bowel syndrome   2. Seasonal allergic rhinitis due to pollen   3. Allergic rhinitis due to animal hair or dander   4. Allergic rhinitis caused by mold   5. Allergic rhinitis due to dust mite   6. Allergic rhinitis due to insect   7. Allergy to alpha-gal     Plan/Recommendations:  Allergic Rhinitis: - Due to turbinate hypertrophy, seasonal symptoms and unresponsive to over the counter meds, performed skin testing to identify aeroallergen triggers.   - Positive skin test 06/2023: trees, grasses, weeds, molds, dust mites, cats, cockroach, tobacco leaf  - Avoidance measures discussed. - Use nasal saline rinses before nose sprays such as with Neilmed Sinus Rinse.  Use distilled water.   - Use Flonase 2 sprays each nostril daily. Aim upward and outward. - Use Azelastine 1-2 sprays each nostril twice daily as needed for runny nose, drainage, sneezing, congestion. Aim upward and outward. - Use Xyzal 5 mg daily as needed for runny nose, sneezing, itchy watery eyes. Avoiding medications  that have Pseudoephedrine for more than a few days.  - For eyes, use Olopatadine or Ketotifen 1 eye drop daily as needed for itchy, watery eyes.  Available over the counter, if not covered by insurance.  - Consider allergy shots as long term control of your symptoms by teaching your immune system to be more tolerant of your allergy triggers  Alpha Gal Syndrome - please strictly avoid mammalian meat - Initial rxn: hives without clear trigger. Also has GI symptoms of constipation/diarrhea/abdominal pain.  - IgE alpha gal 09/2022: 1.59 kU/L - for SKIN only reaction, okay to take Benadryl 25mg  capsules every 6 hours as needed - for SKIN + ANY additional symptoms, OR IF concern for LIFE THREATENING reaction = Epipen Autoinjector EpiPen 0.3 mg. - If using Epinephrine autoinjector, call 911 or go to the ER.   Concern for Food Allergy: - Discussed labs for wheat, soy, eggs, peanut are false positives.  She has eaten them without any IgE mediated symptoms which proves she does not have an IgE mediated food allergy. - Can eat these foods as tolerated.    Possibly Lactose Intolerance - Try lactose free products.  Irritable Bowel Syndrome - Try low FODMAP diet.         Return in about 3 months (around 10/14/2023).  Alesia Morin, MD [facility]

## 2023-07-15 NOTE — Patient Instructions (Addendum)
Allergic Rhinitis: - Positive skin test 06/2023: trees, grasses, weeds, molds, dust mites, cats, cockroach, tobacco leaf  - Avoidance measures discussed. - Use nasal saline rinses before nose sprays such as with Neilmed Sinus Rinse.  Use distilled water.   - Use Flonase 2 sprays each nostril daily. Aim upward and outward. - Use Azelastine 1-2 sprays each nostril twice daily as needed for runny nose, drainage, sneezing, congestion. Aim upward and outward. - Use Xyzal 5 mg daily as needed for runny nose, sneezing, itchy watery eyes. Avoiding medications that have Pseudoephedrine for more than a few days.  - For eyes, use Olopatadine or Ketotifen 1 eye drop daily as needed for itchy, watery eyes.  Available over the counter, if not covered by insurance.  - Consider allergy shots as long term control of your symptoms by teaching your immune system to be more tolerant of your allergy triggers  Alpha Gal Syndrome - please strictly avoid mammalian meat - Initial rxn: hives without clear trigger. Also has GI symptoms of constipation/diarrhea/abdominal pain.  - for SKIN only reaction, okay to take Benadryl 25mg  capsules every 6 hours as needed - for SKIN + ANY additional symptoms, OR IF concern for LIFE THREATENING reaction = Epipen Autoinjector EpiPen 0.3 mg. - If using Epinephrine autoinjector, call 911 or go to the ER.   Concern for Food Allergy: - Discussed labs for wheat, soy, eggs, peanut are false positives.  She has eaten them without any IgE mediated symptoms which proves she does not have an IgE mediated food allergy. - Can eat these foods as tolerated.    Possibly Lactose Intolerance - Try lactose free products for dairy.   Irritable Bowel Syndrome - Try low FODMAP diet.       ALLERGEN AVOIDANCE MEASURES   Dust Mites Use central air conditioning and heat; and change the filter monthly.  Pleated filters work better than mesh filters.  Electrostatic filters may also be used; wash the  filter monthly.  Window air conditioners may be used, but do not clean the air as well as a central air conditioner.  Change or wash the filter monthly. Keep windows closed.  Do not use attic fans.   Encase the mattress, box springs and pillows with zippered, dust proof covers. Wash the bed linens in hot water weekly.   Remove carpet, especially from the bedroom. Remove stuffed animals, throw pillows, dust ruffles, heavy drapes and other items that collect dust from the bedroom. Do not use a humidifier.   Use wood, vinyl or leather furniture instead of cloth furniture in the bedroom. Keep the indoor humidity at 30 - 40%.  Monitor with a humidity gauge.  Molds - Indoor avoidance Use air conditioning to reduce indoor humidity.  Do not use a humidifier. Keep indoor humidity at 30 - 40%.  Use a dehumidifier if needed. In the bathroom use an exhaust fan or open a window after showering.  Wipe down damp surfaces after showering.  Clean bathrooms with a mold-killing solution (diluted bleach, or products like Tilex, etc) at least once a month. In the kitchen use an exhaust fan to remove steam from cooking.  Throw away spoiled foods immediately, and empty garbage daily.  Empty water pans below self-defrosting refrigerators frequently. Vent the clothes dryer to the outside. Limit indoor houseplants; mold grows in the dirt.  No houseplants in the bedroom. Remove carpet from the bedroom. Encase the mattress and box springs with a zippered encasing.  Molds - Outdoor avoidance Avoid being  outside when the grass is being mowed, or the ground is tilled. Avoid playing in leaves, pine straw, hay, etc.  Dead plant materials contain mold. Avoid going into barns or grain storage areas. Remove leaves, clippings and compost from around the home.  Cockroach Limit spread of food around the house; especially keep food out of bedrooms. Keep food and garbage in closed containers with a tight lid.  Never leave food  out in the kitchen.  Do not leave out pet food or dirty food bowls. Mop the kitchen floor and wash countertops at least once a week. Repair leaky pipes and faucets so there is no standing water to attract roaches. Plug up cracks in the house through which cockroaches can enter. Use bait stations and approved pesticides to reduce cockroach infestation. Pollen Avoidance Pollen levels are highest during the mid-day and afternoon.  Consider this when planning outdoor activities. Avoid being outside when the grass is being mowed, or wear a mask if the pollen-allergic person must be the one to mow the grass. Keep the windows closed to keep pollen outside of the home. Use an air conditioner to filter the air. Take a shower, wash hair, and change clothing after working or playing outdoors during pollen season. Pet Dander Keep the pet out of your bedroom and restrict it to only a few rooms. Be advised that keeping the pet in only one room will not limit the allergens to that room. Don't pet, hug or kiss the pet; if you do, wash your hands with soap and water. High-efficiency particulate air (HEPA) cleaners run continuously in a bedroom or living room can reduce allergen levels over time. Regular use of a high-efficiency vacuum cleaner or a central vacuum can reduce allergen levels. Giving your pet a bath at least once a week can reduce airborne allergen.

## 2023-08-22 ENCOUNTER — Ambulatory Visit: Payer: BC Managed Care – PPO | Admitting: Gastroenterology

## 2023-11-01 ENCOUNTER — Ambulatory Visit: Payer: BC Managed Care – PPO | Admitting: Internal Medicine

## 2024-01-10 ENCOUNTER — Other Ambulatory Visit: Payer: Self-pay

## 2024-01-10 ENCOUNTER — Ambulatory Visit: Payer: BC Managed Care – PPO | Admitting: Internal Medicine

## 2024-01-10 ENCOUNTER — Encounter: Payer: Self-pay | Admitting: Internal Medicine

## 2024-01-10 VITALS — BP 118/82 | HR 72 | Temp 98.2°F

## 2024-01-10 DIAGNOSIS — J3089 Other allergic rhinitis: Secondary | ICD-10-CM

## 2024-01-10 DIAGNOSIS — Z91018 Allergy to other foods: Secondary | ICD-10-CM | POA: Diagnosis not present

## 2024-01-10 DIAGNOSIS — J302 Other seasonal allergic rhinitis: Secondary | ICD-10-CM | POA: Diagnosis not present

## 2024-01-10 MED ORDER — AZELASTINE HCL 0.1 % NA SOLN: 1.0000 | Freq: Two times a day (BID) | NASAL | 5 refills | Status: AC | PRN

## 2024-01-10 MED ORDER — LEVOCETIRIZINE DIHYDROCHLORIDE 5 MG PO TABS: 5.0000 mg | ORAL_TABLET | Freq: Every day | ORAL | 5 refills | Status: AC | PRN

## 2024-01-10 MED ORDER — FLUTICASONE PROPIONATE 50 MCG/ACT NA SUSP: 2.0000 | Freq: Every day | NASAL | 5 refills | Status: AC

## 2024-01-10 MED ORDER — OLOPATADINE HCL 0.2 % OP SOLN: 1.0000 [drp] | Freq: Every day | OPHTHALMIC | 5 refills | Status: AC | PRN

## 2024-01-10 NOTE — Patient Instructions (Addendum)
 Allergic Rhinitis: - Positive skin test 06/2023: trees, grasses, weeds, molds, dust mites, cats, cockroach, tobacco leaf  - Use nasal saline rinses before nose sprays such as with Neilmed Sinus Rinse.  Use distilled water.   - Use Flonase 2 sprays each nostril daily. Aim upward and outward. - Use Azelastine 2 sprays each nostril twice daily as needed for runny nose, drainage, sneezing, congestion. Aim upward and outward. - Use Xyzal 5 mg daily as needed for runny nose, sneezing, itchy watery eyes. - For eyes, use Olopatadine or Ketotifen 1 eye drop daily as needed for itchy, watery eyes.  Available over the counter, if not covered by insurance.  - Consider allergy shots as long term control of your symptoms by teaching your immune system to be more tolerant of your allergy triggers  Alpha Gal Syndrome - please strictly avoid mammalian meat - Initial rxn: hives without clear trigger. Also has GI symptoms of constipation/diarrhea/abdominal pain.  - IgE alpha gal 09/2022: 1.59 kU/L  - for SKIN only reaction, okay to take Benadryl 25mg  capsules every 6 hours as needed - for SKIN + ANY additional symptoms, OR IF concern for LIFE THREATENING reaction = Epipen Autoinjector EpiPen 0.3 mg. - If using Epinephrine autoinjector, call 911 or go to the ER.

## 2024-01-10 NOTE — Progress Notes (Signed)
   FOLLOW UP Date of Service/Encounter:  01/10/24   Subjective:  Marisa Chen (DOB: 11/20/1975) is a 48 y.o. female who [facility] for follow up for allergic rhinitis and alpha gal.   History obtained from: chart review and patient. Last visit was on 07/15/2023 with me for allergic rhinitis and alpha gal; positive to multiple aeroallergens, started on Flonase/Zyrtec.  Had a food panel done that showed mild reactivity to eggs/wheat/peanut/soy but she was consuming these foods in her diet; discussed these are false positives and to continue eating them. Also with possible IBS, consider low FODMAP diet and follow up with PCP/GI.   Since last visit, reports doing well.  Not much congestion/drainage/sneezing; however, once the yellow flowers bloom, that's when her symptoms worsen. Currently using Flonase/Azelastine/Zyrtec/Olopatadine PRN.    Avoiding mammalian meat.  Would like to be retested and would be interested in reintroduction.   Past Medical History: Past Medical History:  Diagnosis Date   Allergy    Cholelithiasis    Decreased libido    FHx: migraine headaches    Hyperlipidemia    Overweight(278.02)    Urticaria     Objective:  BP 118/82   Pulse 72   Temp 98.2 F (36.8 C)   SpO2 97%  There is no height or weight on file to calculate BMI. Physical Exam: GEN: alert, well developed HEENT: clear conjunctiva, nose with mild inferior turbinate hypertrophy, pink nasal mucosa, clear rhinorrhea, + cobblestoning HEART: regular rate and rhythm, no murmur LUNGS: clear to auscultation bilaterally, no coughing, unlabored respiration SKIN: no rashes or lesions   Assessment:   1. Seasonal and perennial allergic rhinitis   2. Allergy to alpha-gal     Plan/Recommendations:  Allergic Rhinitis: - Controlled - Positive skin test 06/2023: trees, grasses, weeds, molds, dust mites, cats, cockroach, tobacco leaf  - Use nasal saline rinses  before nose sprays such as with Neilmed Sinus Rinse.  Use distilled water.   - Use Flonase 2 sprays each nostril daily. Aim upward and outward. - Use Azelastine 2 sprays each nostril twice daily as needed for runny nose, drainage, sneezing, congestion. Aim upward and outward. - Use Xyzal 5 mg daily as needed for runny nose, sneezing, itchy watery eyes. - For eyes, use Olopatadine or Ketotifen 1 eye drop daily as needed for itchy, watery eyes.  Available over the counter, if not covered by insurance.  - Consider allergy shots as long term control of your symptoms by teaching your immune system to be more tolerant of your allergy triggers  Alpha Gal Syndrome - please strictly avoid mammalian meat.  Will recheck alpha gal levels today.  - Initial rxn: hives without clear trigger. Also has GI symptoms of constipation/diarrhea/abdominal pain.  - IgE alpha gal 09/2022: 1.59 kU/L  - for SKIN only reaction, okay to take Benadryl 25mg  capsules every 6 hours as needed - for SKIN + ANY additional symptoms, OR IF concern for LIFE THREATENING reaction = Epipen Autoinjector EpiPen 0.3 mg. - If using Epinephrine autoinjector, call 911 or go to the ER.     Return in about 6 months (around 07/12/2024).  Alesia Morin, MD [facility]

## 2024-01-17 LAB — ALPHA-GAL PANEL
Allergen Lamb IgE: <0.1 kU/L
Beef IgE: 0.37 kU/L — AB
IgE (Immunoglobulin E), Serum: 246 [IU]/mL (ref 6–495)
O215-IgE Alpha-Gal: 0.64 kU/L — AB
Pork IgE: 0.13 kU/L — AB

## 2024-07-17 ENCOUNTER — Ambulatory Visit: Admitting: Internal Medicine
